# Patient Record
Sex: Male | Born: 1950 | Race: White | Hispanic: No | Marital: Married | Smoking: Never smoker
Health system: Southern US, Community
[De-identification: ages and names within clinical notes are randomized; demographics above are authoritative.]

## PROBLEM LIST (undated history)

## (undated) DIAGNOSIS — F4310 Post-traumatic stress disorder, unspecified: Secondary | ICD-10-CM

## (undated) DIAGNOSIS — M1990 Unspecified osteoarthritis, unspecified site: Secondary | ICD-10-CM

## (undated) DIAGNOSIS — I10 Essential (primary) hypertension: Secondary | ICD-10-CM

## (undated) HISTORY — PX: CHOLECYSTECTOMY: SHX55

---

## 2018-01-03 HISTORY — PX: BACK SURGERY: SHX140

## 2018-12-23 ENCOUNTER — Other Ambulatory Visit: Payer: Self-pay | Admitting: Neurosurgery

## 2018-12-30 NOTE — Pre-Procedure Instructions (Addendum)
Steven Cohen  12/30/2018      [facility] [address] [place] [address] Phone: [phone] Fax: [phone]    Your procedure is scheduled on Dec. 4  Report to [facility] Entrance A at 7:30 A.M.  Call this number if you have problems the morning of surgery:  [phone]   Remember:  Do not eat or drink after midnight.      Take these medicines the morning of surgery with A SIP OF WATER :              Methocarbamol (robaxin) if needed             Eye drops if needed               7 days prior to surgery STOP taking any Aspirin (unless otherwise instructed by your surgeon), Aleve, Naproxen, Ibuprofen, Motrin, Advil, Goody's, BC's, all herbal medications, fish oil, and all vitamins.    Do not wear jewelry.  Do not wear lotions, powders, or perfumes, or deodorant.  Do not shave 48 hours prior to surgery.  Men may shave face and neck.  Do not bring valuables to the hospital.  [facility] is not responsible for any belongings or valuables.  Contacts, dentures or bridgework may not be worn into surgery.  Leave your suitcase in the car.  After surgery it may be brought to your room.  For patients admitted to the hospital, discharge time will be determined by your treatment team.  Patients discharged the day of surgery will not be allowed to drive home.    Special instructions:   [facility]- Preparing For Surgery  Before surgery, you can play an important role. Because skin is not sterile, your skin needs to be as free of germs as possible. You can reduce the number of germs on your skin by washing with CHG (chlorahexidine gluconate) Soap before surgery.  CHG is an antiseptic cleaner which kills germs and bonds with the skin to continue killing germs even after washing.    Oral Hygiene is also important to reduce your risk of infection.  Remember - BRUSH YOUR TEETH THE MORNING OF SURGERY WITH  YOUR REGULAR TOOTHPASTE  Please do not use if you have an allergy to CHG or antibacterial soaps. If your skin becomes reddened/irritated stop using the CHG.  Do not shave (including legs and underarms) for at least 48 hours prior to first CHG shower. It is OK to shave your face.  Please follow these instructions carefully.   1. Shower the NIGHT BEFORE SURGERY and the MORNING OF SURGERY with CHG.   2. If you chose to wash your hair, wash your hair first as usual with your normal shampoo.  3. After you shampoo, rinse your hair and body thoroughly to remove the shampoo.  4. Use CHG as you would any other liquid soap. You can apply CHG directly to the skin and wash gently with a scrungie or a clean washcloth.   5. Apply the CHG Soap to your body ONLY FROM THE NECK DOWN.  Do not use on open wounds or open sores. Avoid contact with your eyes, ears, mouth and genitals (private parts). Wash Face and genitals (private parts)  with your normal soap.  6. Wash thoroughly, paying special attention to the area where your surgery will be performed.  7. Thoroughly rinse your body with warm water from the neck down.  8. DO NOT shower/wash with your normal soap after using and rinsing off the CHG Soap.  9. Pat yourself dry with a CLEAN TOWEL.  10. Wear CLEAN PAJAMAS to bed the night before surgery, wear comfortable clothes the morning of surgery  11. Place CLEAN SHEETS on your bed the night of your first shower and DO NOT SLEEP WITH PETS.    Day of Surgery:  Do not apply any deodorants/lotions.  Please wear clean clothes to the hospital/surgery center.   Remember to brush your teeth WITH YOUR REGULAR TOOTHPASTE.    Please read over the following fact sheets that you were given. Coughing and Deep Breathing and Surgical Site Infection Prevention

## 2018-12-31 ENCOUNTER — Other Ambulatory Visit (HOSPITAL_COMMUNITY)
Admission: RE | Admit: 2018-12-31 | Discharge: 2018-12-31 | Disposition: A | Discharge status: Home / Self Care | Payer: No Typology Code available for payment source | Source: Ambulatory Visit | Attending: Neurosurgery | Admitting: Neurosurgery

## 2018-12-31 ENCOUNTER — Other Ambulatory Visit: Payer: Self-pay

## 2018-12-31 ENCOUNTER — Encounter (HOSPITAL_COMMUNITY)
Admission: RE | Admit: 2018-12-31 | Discharge: 2018-12-31 | Disposition: A | Discharge status: Home / Self Care | Payer: No Typology Code available for payment source | Source: Ambulatory Visit | Attending: Neurosurgery | Admitting: Neurosurgery

## 2018-12-31 ENCOUNTER — Encounter (HOSPITAL_COMMUNITY): Payer: Self-pay

## 2018-12-31 DIAGNOSIS — I498 Other specified cardiac arrhythmias: Secondary | ICD-10-CM | POA: Insufficient documentation

## 2018-12-31 DIAGNOSIS — Z01818 Encounter for other preprocedural examination: Secondary | ICD-10-CM | POA: Insufficient documentation

## 2018-12-31 HISTORY — DX: Unspecified osteoarthritis, unspecified site: M19.90

## 2018-12-31 HISTORY — DX: Post-traumatic stress disorder, unspecified: F43.10

## 2018-12-31 HISTORY — DX: Essential (primary) hypertension: I10

## 2018-12-31 LAB — CBC WITH DIFFERENTIAL/PLATELET
Abs Immature Granulocytes: 0.03 10*3/uL (ref 0.00–0.07)
Basophils Absolute: 0.1 10*3/uL (ref 0.0–0.1)
Basophils Relative: 1 %
Eosinophils Absolute: 0.2 10*3/uL (ref 0.0–0.5)
Eosinophils Relative: 2 %
HCT: 42.7 % (ref 39.0–52.0)
Hemoglobin: 14.2 g/dL (ref 13.0–17.0)
Immature Granulocytes: 0 %
Lymphocytes Relative: 23 %
Lymphs Abs: 1.7 10*3/uL (ref 0.7–4.0)
MCH: 33.6 pg (ref 26.0–34.0)
MCHC: 33.3 g/dL (ref 30.0–36.0)
MCV: 100.9 fL — ABNORMAL HIGH (ref 80.0–100.0)
Monocytes Absolute: 0.7 10*3/uL (ref 0.1–1.0)
Monocytes Relative: 10 %
Neutro Abs: 4.8 10*3/uL (ref 1.7–7.7)
Neutrophils Relative %: 64 %
Platelets: 250 10*3/uL (ref 150–400)
RBC: 4.23 MIL/uL (ref 4.22–5.81)
RDW: 12.9 % (ref 11.5–15.5)
WBC: 7.5 10*3/uL (ref 4.0–10.5)
nRBC: 0 % (ref 0.0–0.2)

## 2018-12-31 LAB — SURGICAL PCR SCREEN
MRSA, PCR: NEGATIVE
Staphylococcus aureus: NEGATIVE

## 2018-12-31 LAB — TYPE AND SCREEN
ABO/RH(D): O POS
Antibody Screen: NEGATIVE

## 2018-12-31 LAB — ABO/RH: ABO/RH(D): O POS

## 2018-12-31 NOTE — Progress Notes (Signed)
PCP - [facility] in [facility] Cardiologist - na  PPM/ICD - na Device Orders - na Rep Notified -   Chest x-ray - na EKG - 12/31/18 Stress Test - na ECHO - na Cardiac Cath - na  Sleep Study - na CPAP -   Fasting Blood Sugar - na Checks Blood Sugar _____ times a day  Blood Thinner Instructions:na Aspirin Instructions:na  ERAS Protcol -na PRE-SURGERY Ensure or G2-   COVID TEST- 12/31/18   Anesthesia review:   Patient denies shortness of breath, fever, cough and chest pain at PAT appointment   All instructions explained to the patient, with a verbal understanding of the material. Patient agrees to go over the instructions while at home for a better understanding. Patient also instructed to self quarantine after being tested for COVID-19. The opportunity to ask questions was provided.

## 2019-01-02 LAB — NOVEL CORONAVIRUS, NAA (HOSP ORDER, SEND-OUT TO REF LAB; TAT 18-24 HRS): SARS-CoV-2, NAA: NOT DETECTED

## 2019-01-02 NOTE — Anesthesia Preprocedure Evaluation (Addendum)
Anesthesia Evaluation  Patient identified by MRN, date of birth, ID band Patient awake    Reviewed: Allergy & Precautions, NPO status , Patient's Chart, lab work & pertinent test results  Airway Mallampati: III  TM Distance: >3 FB Neck ROM: Full    Dental  (+) Edentulous Upper, Edentulous Lower, Dental Advisory Given   Pulmonary neg pulmonary ROS,    Pulmonary exam normal breath sounds clear to auscultation       Cardiovascular hypertension, Pt. on medications Normal cardiovascular exam Rhythm:Regular Rate:Normal  ECG: NSR, rate 67   Neuro/Psych PSYCHIATRIC DISORDERS Anxiety PTSD (post-traumatic stress disorder)negative neurological ROS     GI/Hepatic negative GI ROS, Neg liver ROS,   Endo/Other  negative endocrine ROS  Renal/GU negative Renal ROS     Musculoskeletal negative musculoskeletal ROS (+)   Abdominal   Peds  Hematology negative hematology ROS (+)   Anesthesia Other Findings Spondylolisthesis  Reproductive/Obstetrics                           Anesthesia Physical Anesthesia Plan  ASA: II  Anesthesia Plan: General   Post-op Pain Management:    Induction: Intravenous  PONV Risk Score and Plan: 3 and Midazolam, Dexamethasone, Ondansetron and Treatment may vary due to age or medical condition  Airway Management Planned: Oral ETT  Additional Equipment:   Intra-op Plan:   Post-operative Plan: Extubation in OR  Informed Consent: I have reviewed the patients History and Physical, chart, labs and discussed the procedure including the risks, benefits and alternatives for the proposed anesthesia with the patient or authorized representative who has indicated his/her understanding and acceptance.       Plan Discussed with: CRNA  Anesthesia Plan Comments:        Anesthesia Quick Evaluation

## 2019-01-03 ENCOUNTER — Other Ambulatory Visit: Payer: Self-pay

## 2019-01-03 ENCOUNTER — Encounter (HOSPITAL_COMMUNITY): Payer: Self-pay

## 2019-01-03 ENCOUNTER — Inpatient Hospital Stay (HOSPITAL_COMMUNITY): Payer: No Typology Code available for payment source | Admitting: Anesthesiology

## 2019-01-03 ENCOUNTER — Inpatient Hospital Stay (HOSPITAL_COMMUNITY)
Admission: RE | Admit: 2019-01-03 | Discharge: 2019-01-04 | DRG: 455 | Disposition: A | Discharge status: Home / Self Care | Payer: No Typology Code available for payment source | Attending: Neurosurgery | Admitting: Neurosurgery

## 2019-01-03 ENCOUNTER — Inpatient Hospital Stay (HOSPITAL_COMMUNITY)
Admission: RE | Disposition: A | Discharge status: Home / Self Care | Payer: Self-pay | Source: Home / Self Care | Attending: Neurosurgery

## 2019-01-03 ENCOUNTER — Inpatient Hospital Stay (HOSPITAL_COMMUNITY): Payer: No Typology Code available for payment source

## 2019-01-03 DIAGNOSIS — M48061 Spinal stenosis, lumbar region without neurogenic claudication: Secondary | ICD-10-CM | POA: Diagnosis present

## 2019-01-03 DIAGNOSIS — Z419 Encounter for procedure for purposes other than remedying health state, unspecified: Secondary | ICD-10-CM

## 2019-01-03 DIAGNOSIS — M4807 Spinal stenosis, lumbosacral region: Secondary | ICD-10-CM | POA: Diagnosis present

## 2019-01-03 DIAGNOSIS — Z888 Allergy status to other drugs, medicaments and biological substances status: Secondary | ICD-10-CM | POA: Diagnosis not present

## 2019-01-03 DIAGNOSIS — Z20828 Contact with and (suspected) exposure to other viral communicable diseases: Secondary | ICD-10-CM | POA: Diagnosis present

## 2019-01-03 DIAGNOSIS — M4316 Spondylolisthesis, lumbar region: Secondary | ICD-10-CM | POA: Diagnosis present

## 2019-01-03 DIAGNOSIS — M5116 Intervertebral disc disorders with radiculopathy, lumbar region: Secondary | ICD-10-CM | POA: Diagnosis present

## 2019-01-03 DIAGNOSIS — Z886 Allergy status to analgesic agent status: Secondary | ICD-10-CM

## 2019-01-03 DIAGNOSIS — M4726 Other spondylosis with radiculopathy, lumbar region: Secondary | ICD-10-CM | POA: Diagnosis present

## 2019-01-03 DIAGNOSIS — Z880 Allergy status to penicillin: Secondary | ICD-10-CM | POA: Diagnosis not present

## 2019-01-03 DIAGNOSIS — M4310 Spondylolisthesis, site unspecified: Secondary | ICD-10-CM | POA: Diagnosis present

## 2019-01-03 DIAGNOSIS — Z79899 Other long term (current) drug therapy: Secondary | ICD-10-CM | POA: Diagnosis not present

## 2019-01-03 DIAGNOSIS — I10 Essential (primary) hypertension: Secondary | ICD-10-CM | POA: Diagnosis present

## 2019-01-03 LAB — BASIC METABOLIC PANEL
Anion gap: 11 (ref 5–15)
BUN: 17 mg/dL (ref 8–23)
CO2: 21 mmol/L — ABNORMAL LOW (ref 22–32)
Calcium: 9.8 mg/dL (ref 8.9–10.3)
Chloride: 106 mmol/L (ref 98–111)
Creatinine, Ser: 0.96 mg/dL (ref 0.61–1.24)
GFR calc Af Amer: >60 mL/min (ref >60)
GFR calc non Af Amer: >60 mL/min (ref >60)
Glucose, Bld: 112 mg/dL — ABNORMAL HIGH (ref 70–99)
Potassium: 4 mmol/L (ref 3.5–5.1)
Sodium: 138 mmol/L (ref 135–145)

## 2019-01-03 SURGERY — POSTERIOR LUMBAR FUSION 2 LEVEL
Anesthesia: General | Site: Back

## 2019-01-03 MED ORDER — CEFAZOLIN SODIUM-DEXTROSE 2-4 GM/100ML-% IV SOLN
2.0000 g | Freq: Three times a day (TID) | INTRAVENOUS | Status: AC
  Administered 2019-01-03 – 2019-01-04 (×2): 2 g via INTRAVENOUS
  Filled 2019-01-03 (×2): qty 100

## 2019-01-03 MED ORDER — PRESERVISION AREDS 2 PO CAPS: ORAL_CAPSULE | Freq: Two times a day (BID) | ORAL | Status: DC

## 2019-01-03 MED ORDER — SUGAMMADEX SODIUM 200 MG/2ML IV SOLN
INTRAVENOUS | Status: DC | PRN
  Administered 2019-01-03: 200 mg via INTRAVENOUS

## 2019-01-03 MED ORDER — PHENOL 1.4 % MT LIQD: 1.0000 | OROMUCOSAL | Status: DC | PRN

## 2019-01-03 MED ORDER — MENTHOL 3 MG MT LOZG: 1.0000 | LOZENGE | OROMUCOSAL | Status: DC | PRN

## 2019-01-03 MED ORDER — PROPOFOL 10 MG/ML IV BOLUS
INTRAVENOUS | Status: AC
  Filled 2019-01-03: qty 20

## 2019-01-03 MED ORDER — PROPOFOL 10 MG/ML IV BOLUS
INTRAVENOUS | Status: DC | PRN
  Administered 2019-01-03: 150 mg via INTRAVENOUS

## 2019-01-03 MED ORDER — ONDANSETRON HCL 4 MG PO TABS: 4.0000 mg | ORAL_TABLET | Freq: Four times a day (QID) | ORAL | Status: DC | PRN

## 2019-01-03 MED ORDER — FENTANYL CITRATE (PF) 250 MCG/5ML IJ SOLN
INTRAMUSCULAR | Status: AC
  Filled 2019-01-03: qty 5

## 2019-01-03 MED ORDER — ACETAMINOPHEN 500 MG PO TABS
1000.0000 mg | ORAL_TABLET | Freq: Once | ORAL | Status: AC
  Administered 2019-01-03: 1000 mg via ORAL
  Filled 2019-01-03: qty 2

## 2019-01-03 MED ORDER — ONDANSETRON HCL 4 MG/2ML IJ SOLN
INTRAMUSCULAR | Status: AC
  Filled 2019-01-03: qty 4

## 2019-01-03 MED ORDER — MIDAZOLAM HCL 5 MG/5ML IJ SOLN
INTRAMUSCULAR | Status: DC | PRN
  Administered 2019-01-03: 2 mg via INTRAVENOUS

## 2019-01-03 MED ORDER — ONDANSETRON HCL 4 MG/2ML IJ SOLN: 4.0000 mg | Freq: Four times a day (QID) | INTRAMUSCULAR | Status: DC | PRN

## 2019-01-03 MED ORDER — PHENYLEPHRINE 40 MCG/ML (10ML) SYRINGE FOR IV PUSH (FOR BLOOD PRESSURE SUPPORT)
PREFILLED_SYRINGE | INTRAVENOUS | Status: AC
  Filled 2019-01-03: qty 10

## 2019-01-03 MED ORDER — HYDROMORPHONE HCL 1 MG/ML IJ SOLN: 1.0000 mg | INTRAMUSCULAR | Status: DC | PRN

## 2019-01-03 MED ORDER — GLYCOPYRROLATE PF 0.2 MG/ML IJ SOSY
PREFILLED_SYRINGE | INTRAMUSCULAR | Status: AC
  Filled 2019-01-03: qty 1

## 2019-01-03 MED ORDER — VITAMIN D3 10 MCG (400 UNIT) PO TABS: 400.0000 [IU] | ORAL_TABLET | ORAL | Status: DC

## 2019-01-03 MED ORDER — VANCOMYCIN HCL 1000 MG IV SOLR
INTRAVENOUS | Status: DC | PRN
  Administered 2019-01-03: 1000 mg

## 2019-01-03 MED ORDER — VANCOMYCIN HCL 1000 MG IV SOLR
INTRAVENOUS | Status: AC
  Filled 2019-01-03: qty 1000

## 2019-01-03 MED ORDER — MIDAZOLAM HCL 2 MG/2ML IJ SOLN
INTRAMUSCULAR | Status: AC
  Filled 2019-01-03: qty 2

## 2019-01-03 MED ORDER — DEXAMETHASONE SODIUM PHOSPHATE 10 MG/ML IJ SOLN
10.0000 mg | Freq: Once | INTRAMUSCULAR | Status: AC
  Administered 2019-01-03: 12:00:00 10 mg via INTRAVENOUS
  Filled 2019-01-03: qty 1

## 2019-01-03 MED ORDER — CHLORHEXIDINE GLUCONATE CLOTH 2 % EX PADS: 6.0000 | MEDICATED_PAD | Freq: Once | CUTANEOUS | Status: DC

## 2019-01-03 MED ORDER — SODIUM CHLORIDE 0.9 % IV SOLN: 250.0000 mL | INTRAVENOUS | Status: DC

## 2019-01-03 MED ORDER — VITAMIN C 500 MG PO TABS: 500.0000 mg | ORAL_TABLET | Freq: Every day | ORAL | Status: DC

## 2019-01-03 MED ORDER — DIPHENHYDRAMINE HCL 50 MG/ML IJ SOLN
INTRAMUSCULAR | Status: AC
  Filled 2019-01-03: qty 1

## 2019-01-03 MED ORDER — THROMBIN 5000 UNITS EX SOLR
CUTANEOUS | Status: AC
  Filled 2019-01-03: qty 5000

## 2019-01-03 MED ORDER — FLEET ENEMA 7-19 GM/118ML RE ENEM: 1.0000 | ENEMA | Freq: Once | RECTAL | Status: DC | PRN

## 2019-01-03 MED ORDER — PHENYLEPHRINE HCL-NACL 10-0.9 MG/250ML-% IV SOLN
INTRAVENOUS | Status: DC | PRN
  Administered 2019-01-03: 30 ug/min via INTRAVENOUS
  Administered 2019-01-03: 75 ug/min via INTRAVENOUS

## 2019-01-03 MED ORDER — ACETAMINOPHEN 325 MG PO TABS: 650.0000 mg | ORAL_TABLET | ORAL | Status: DC | PRN

## 2019-01-03 MED ORDER — ONDANSETRON HCL 4 MG/2ML IJ SOLN
INTRAMUSCULAR | Status: AC
  Filled 2019-01-03: qty 2

## 2019-01-03 MED ORDER — OXYCODONE HCL 5 MG PO TABS
10.0000 mg | ORAL_TABLET | ORAL | Status: DC | PRN
  Administered 2019-01-03 – 2019-01-04 (×4): 10 mg via ORAL
  Filled 2019-01-03 (×4): qty 2

## 2019-01-03 MED ORDER — THROMBIN 20000 UNITS EX SOLR
CUTANEOUS | Status: AC
  Filled 2019-01-03: qty 20000

## 2019-01-03 MED ORDER — DEXAMETHASONE SODIUM PHOSPHATE 10 MG/ML IJ SOLN
INTRAMUSCULAR | Status: AC
  Filled 2019-01-03: qty 2

## 2019-01-03 MED ORDER — OMEGA-3-ACID ETHYL ESTERS 1 G PO CAPS: 1.0000 g | ORAL_CAPSULE | Freq: Two times a day (BID) | ORAL | Status: DC

## 2019-01-03 MED ORDER — DIAZEPAM 5 MG PO TABS
5.0000 mg | ORAL_TABLET | Freq: Four times a day (QID) | ORAL | Status: DC | PRN
  Administered 2019-01-03 (×2): 5 mg via ORAL
  Administered 2019-01-04: 10 mg via ORAL
  Filled 2019-01-03: qty 1
  Filled 2019-01-03: qty 2
  Filled 2019-01-03: qty 1

## 2019-01-03 MED ORDER — ROCURONIUM BROMIDE 10 MG/ML (PF) SYRINGE
PREFILLED_SYRINGE | INTRAVENOUS | Status: AC
  Filled 2019-01-03: qty 10

## 2019-01-03 MED ORDER — SODIUM CHLORIDE 0.9% FLUSH: 3.0000 mL | Freq: Two times a day (BID) | INTRAVENOUS | Status: DC

## 2019-01-03 MED ORDER — POLYVINYL ALCOHOL 1.4 % OP SOLN
1.0000 [drp] | Freq: Three times a day (TID) | OPHTHALMIC | Status: DC | PRN
  Filled 2019-01-03: qty 15

## 2019-01-03 MED ORDER — CARBOXYMETHYLCELLULOSE SODIUM 0.25 % OP SOLN: 1.0000 [drp] | Freq: Three times a day (TID) | OPHTHALMIC | Status: DC | PRN

## 2019-01-03 MED ORDER — POLYETHYLENE GLYCOL 3350 17 G PO PACK: 17.0000 g | PACK | Freq: Every day | ORAL | Status: DC | PRN

## 2019-01-03 MED ORDER — HYDROCODONE-ACETAMINOPHEN 10-325 MG PO TABS: 1.0000 | ORAL_TABLET | ORAL | Status: DC | PRN

## 2019-01-03 MED ORDER — METHOCARBAMOL 500 MG PO TABS
500.0000 mg | ORAL_TABLET | Freq: Three times a day (TID) | ORAL | Status: DC | PRN
  Administered 2019-01-04: 500 mg via ORAL
  Filled 2019-01-03: qty 1

## 2019-01-03 MED ORDER — ACETAMINOPHEN 650 MG RE SUPP: 650.0000 mg | RECTAL | Status: DC | PRN

## 2019-01-03 MED ORDER — BISACODYL 10 MG RE SUPP: 10.0000 mg | Freq: Every day | RECTAL | Status: DC | PRN

## 2019-01-03 MED ORDER — ONDANSETRON HCL 4 MG/2ML IJ SOLN: 4.0000 mg | Freq: Once | INTRAMUSCULAR | Status: DC | PRN

## 2019-01-03 MED ORDER — ROCURONIUM BROMIDE 10 MG/ML (PF) SYRINGE
PREFILLED_SYRINGE | INTRAVENOUS | Status: DC | PRN
  Administered 2019-01-03: 50 mg via INTRAVENOUS
  Administered 2019-01-03: 100 mg via INTRAVENOUS

## 2019-01-03 MED ORDER — BUPIVACAINE HCL (PF) 0.25 % IJ SOLN
INTRAMUSCULAR | Status: AC
  Filled 2019-01-03: qty 30

## 2019-01-03 MED ORDER — THROMBIN 20000 UNITS EX SOLR
CUTANEOUS | Status: DC | PRN
  Administered 2019-01-03: 20 mL via TOPICAL

## 2019-01-03 MED ORDER — CEFAZOLIN SODIUM-DEXTROSE 2-4 GM/100ML-% IV SOLN
2.0000 g | INTRAVENOUS | Status: AC
  Administered 2019-01-03: 2 g via INTRAVENOUS
  Filled 2019-01-03: qty 100

## 2019-01-03 MED ORDER — FENTANYL CITRATE (PF) 250 MCG/5ML IJ SOLN
INTRAMUSCULAR | Status: DC | PRN
  Administered 2019-01-03: 100 ug via INTRAVENOUS
  Administered 2019-01-03 (×3): 50 ug via INTRAVENOUS
  Administered 2019-01-03: 100 ug via INTRAVENOUS
  Administered 2019-01-03: 50 ug via INTRAVENOUS

## 2019-01-03 MED ORDER — LISINOPRIL 20 MG PO TABS
20.0000 mg | ORAL_TABLET | Freq: Every evening | ORAL | Status: DC
  Administered 2019-01-03: 20 mg via ORAL
  Filled 2019-01-03: qty 1

## 2019-01-03 MED ORDER — THROMBIN (RECOMBINANT) 20000 UNITS EX SOLR
CUTANEOUS | Status: AC
  Filled 2019-01-03: qty 20000

## 2019-01-03 MED ORDER — FENTANYL CITRATE (PF) 100 MCG/2ML IJ SOLN
25.0000 ug | INTRAMUSCULAR | Status: DC | PRN
  Administered 2019-01-03 (×2): 50 ug via INTRAVENOUS

## 2019-01-03 MED ORDER — LIDOCAINE 2% (20 MG/ML) 5 ML SYRINGE
INTRAMUSCULAR | Status: AC
  Filled 2019-01-03: qty 5

## 2019-01-03 MED ORDER — LIDOCAINE 2% (20 MG/ML) 5 ML SYRINGE
INTRAMUSCULAR | Status: AC
  Filled 2019-01-03: qty 10

## 2019-01-03 MED ORDER — DEXAMETHASONE SODIUM PHOSPHATE 10 MG/ML IJ SOLN
INTRAMUSCULAR | Status: AC
  Filled 2019-01-03: qty 1

## 2019-01-03 MED ORDER — FENTANYL CITRATE (PF) 100 MCG/2ML IJ SOLN
INTRAMUSCULAR | Status: AC
  Filled 2019-01-03: qty 2

## 2019-01-03 MED ORDER — SODIUM CHLORIDE 0.9 % IV SOLN
INTRAVENOUS | Status: DC | PRN
  Administered 2019-01-03: 1000 mL

## 2019-01-03 MED ORDER — VITAMIN E 180 MG (400 UNIT) PO CAPS: 400.0000 [IU] | ORAL_CAPSULE | ORAL | Status: DC

## 2019-01-03 MED ORDER — SODIUM CHLORIDE 0.9% FLUSH: 3.0000 mL | INTRAVENOUS | Status: DC | PRN

## 2019-01-03 MED ORDER — 0.9 % SODIUM CHLORIDE (POUR BTL) OPTIME
TOPICAL | Status: DC | PRN
  Administered 2019-01-03: 1000 mL

## 2019-01-03 MED ORDER — ONDANSETRON HCL 4 MG/2ML IJ SOLN
INTRAMUSCULAR | Status: DC | PRN
  Administered 2019-01-03: 4 mg via INTRAVENOUS

## 2019-01-03 MED ORDER — ALBUMIN HUMAN 5 % IV SOLN
INTRAVENOUS | Status: DC | PRN
  Administered 2019-01-03: 13:00:00 via INTRAVENOUS

## 2019-01-03 MED ORDER — LACTATED RINGERS IV SOLN
INTRAVENOUS | Status: DC | PRN
  Administered 2019-01-03 (×3): via INTRAVENOUS

## 2019-01-03 SURGICAL SUPPLY — 53 items
BAG DECANTER FOR FLEXI CONT (MISCELLANEOUS) ×3 IMPLANT
BENZOIN TINCTURE PRP APPL 2/3 (GAUZE/BANDAGES/DRESSINGS) ×3 IMPLANT
BLADE CLIPPER SURG (BLADE) IMPLANT
BUR CUTTER 7.0 ROUND (BURR) IMPLANT
CANISTER SUCT 3000ML PPV (MISCELLANEOUS) ×3 IMPLANT
CAP LCK SPNE (Orthopedic Implant) ×6 IMPLANT
CAP LOCK SPINE RADIUS (Orthopedic Implant) ×6 IMPLANT
CAP LOCKING (Orthopedic Implant) ×12 IMPLANT
CARTRIDGE OIL MAESTRO DRILL (MISCELLANEOUS) ×1 IMPLANT
COVER BACK TABLE 60X90IN (DRAPES) ×3 IMPLANT
DECANTER SPIKE VIAL GLASS SM (MISCELLANEOUS) ×3 IMPLANT
DEVICE INTERBODY ELEVATE 10X23 (Cage) ×6 IMPLANT
DEVICE INTERBODY ELEVATE 23X10 (Cage) ×6 IMPLANT
DIFFUSER DRILL AIR PNEUMATIC (MISCELLANEOUS) ×3 IMPLANT
DRAPE C-ARM 42X72 X-RAY (DRAPES) ×6 IMPLANT
DRAPE HALF SHEET 40X57 (DRAPES) IMPLANT
DRAPE LAPAROTOMY 100X72X124 (DRAPES) ×3 IMPLANT
DRAPE SURG 17X23 STRL (DRAPES) ×12 IMPLANT
DRSG OPSITE POSTOP 4X8 (GAUZE/BANDAGES/DRESSINGS) ×3 IMPLANT
DURAPREP 26ML APPLICATOR (WOUND CARE) ×3 IMPLANT
ELECT REM PT RETURN 9FT ADLT (ELECTROSURGICAL) ×3
ELECTRODE REM PT RTRN 9FT ADLT (ELECTROSURGICAL) ×1 IMPLANT
GLOVE BIO SURGEON STRL SZ 6.5 (GLOVE) ×2 IMPLANT
GLOVE BIO SURGEONS STRL SZ 6.5 (GLOVE) ×1
GLOVE BIOGEL PI IND STRL 6.5 (GLOVE) ×1 IMPLANT
GLOVE BIOGEL PI INDICATOR 6.5 (GLOVE) ×2
GLOVE ECLIPSE 9.0 STRL (GLOVE) ×6 IMPLANT
GOWN STRL REUS W/ TWL LRG LVL3 (GOWN DISPOSABLE) IMPLANT
GOWN STRL REUS W/ TWL XL LVL3 (GOWN DISPOSABLE) ×2 IMPLANT
GOWN STRL REUS W/TWL 2XL LVL3 (GOWN DISPOSABLE) IMPLANT
GOWN STRL REUS W/TWL LRG LVL3 (GOWN DISPOSABLE)
GOWN STRL REUS W/TWL XL LVL3 (GOWN DISPOSABLE) ×4
KIT BASIN OR (CUSTOM PROCEDURE TRAY) ×3 IMPLANT
KIT TURNOVER KIT B (KITS) ×3 IMPLANT
MILL MEDIUM DISP (BLADE) ×3 IMPLANT
NEEDLE HYPO 22GX1.5 SAFETY (NEEDLE) ×3 IMPLANT
NS IRRIG 1000ML POUR BTL (IV SOLUTION) ×3 IMPLANT
OIL CARTRIDGE MAESTRO DRILL (MISCELLANEOUS) ×3
PACK LAMINECTOMY NEURO (CUSTOM PROCEDURE TRAY) ×3 IMPLANT
ROD 70MM (Rod) ×4 IMPLANT
ROD SPNL 70X5.5 NS TI RDS (Rod) ×2 IMPLANT
SCREW 5.75 X 635 (Screw) ×3 IMPLANT
SCREW 5.75X40M (Screw) ×3 IMPLANT
SCREW 5.75X45MM (Screw) ×12 IMPLANT
SPONGE LAP 4X18 RFD (DISPOSABLE) ×3 IMPLANT
SPONGE SURGIFOAM ABS GEL 100 (HEMOSTASIS) ×3 IMPLANT
SUT VIC AB 0 CT1 18XCR BRD8 (SUTURE) ×2 IMPLANT
SUT VIC AB 0 CT1 8-18 (SUTURE) ×4
SUT VIC AB 2-0 CT1 18 (SUTURE) ×3 IMPLANT
SUT VIC AB 3-0 SH 8-18 (SUTURE) ×6 IMPLANT
TOWEL GREEN STERILE (TOWEL DISPOSABLE) ×3 IMPLANT
TOWEL GREEN STERILE FF (TOWEL DISPOSABLE) ×3 IMPLANT
TRAY FOLEY MTR SLVR 16FR STAT (SET/KITS/TRAYS/PACK) ×3 IMPLANT

## 2019-01-03 NOTE — Op Note (Signed)
Date of procedure: 01/03/2019  Date of dictation: Same  Service: Neurosurgery  Preoperative diagnosis: L4-5 facet arthropathy with stenosis and radiculopathy, L5-S1 grade 2 degenerative spondylolisthesis with severe foraminal stenosis and radiculopathy.  Postoperative diagnosis: Same  Procedure Name: Bilateral L4-5 and L5-S1 decompressive laminotomies and foraminotomies, more than would be required for simple interbody fusion alone.  L5-S1 ponte osteotomies for sagittal plane correction  L4-5 and L5-S1 posterior lumbar interbody fusion utilizing interbody cages and locally harvested autograft  L4-5 and S1 posterior lateral arthrodesis utilizing segmental pedicle screw fixation and local autograft  Surgeon:Stephaniemarie Stoffel A.Dezmon Conover, M.D.  Asst. Surgeon: Doran Durand, NP  Anesthesia: General  Indication: 68 year old male with intractable back and bilateral lower extremity pain.  Work-up demonstrates evidence of degenerative spondylolisthesis with severe foraminal stenosis at L5-S1.  Patient also with marked facet arthropathy with facet joint diastases and resultant stenosis at L4-5.  Patient presents now for two-level lumbar decompression and fusion in hopes of improving his symptoms.  Operative note: After induction of anesthesia, patient position prone onto Wilson frame and appropriate padded.  Patient's lumbar region prepped and draped in a typical fashion.  Incision made from L4-S1.  Dissection performed bilaterally.  Retractor placed.  Fluoroscopy used.  Levels confirmed.  Decompressive laminotomies and facetectomies were then performed bilaterally at L4-5 and L5-S1 to remove the inferior aspect of the lamina of L4 the entire inferior facet of L4 bilaterally the superior aspect of the lamina of L5 the inferior aspect of the lamina of L5 the entire facet joint complex at L5-S1 bilaterally and the superior rim of the S1 lamina.  Ligament flavum elevated and resected.  Aggressive foraminotomies completed on  the course the exiting L4-L5 and S1 nerve roots.  Epidural venous plexus coagulated and cut.  Ponte osteotomies were then completed at L5-S1 to facilitate reduction of the patient's deformity at L5-S1.  Bilateral discectomies then performed at L5-S1.  This patient then prepared for interbody fusion.  With a distractor placed patient's right side to space was cleaned of all soft tissue.  A 10 mm extra lordotic [employer] expandable cage packed with locally harvested autograft was impacted into place at L5-S1 and expanded to its full extent.  Distractor removed patient's right side.  The space prepared on the right side.  Morselized autograft packed in the interspace.  The second cage packed with autograft was then impacted in the place and expanded to its full extent.  Procedures then repeated at L4-5 in a similar fashion utilizing 10 mm standard expandable cages and local autograft.  Pedicles at L4-L5 and S1 were then identified using surface landmarks and intraoperative fluoroscopy.  Superficial bone overlying the pedicle was then removed and high-speed drill.  Pedicle was then probed using a pedicle awl.  Each pedicle all track was probed and found to be solidly within the bone.  Each pedicle all track was then tapped with a screw tap.  Each screw temple was probed and found to be solid within the bone.  5.75 mm radius brand screws from [employer] were placed bilaterally at L4-L5 and S1.  Final images reveal good position of the cages and the hardware at the proper operative level with normal alignment of spine.  Wound is then irrigated one final time.  Transverse processes and residual facets were decorticated.  Morselized autograft was packed posterior laterally.  Short segment titanium rod was then placed over the screw heads at L4-L5 and S1.  Locking caps placed over the screws.  Locking caps then engaged with  a construct under mild compression.  Gelfoam was placed over the laminotomy defects.  Vancomycin  powder was placed in deep wound space.  Wound was then closed in layers of Vicryl sutures.  Steri-Strips and sterile dressing were applied.  No apparent complications.  The patient tolerated the procedure well and he returns to the recovery room postop.

## 2019-01-03 NOTE — Progress Notes (Signed)
Pharmacy Antibiotic Note  Steven Cohen is a 68 y.o. male admitted on 01/03/2019 with spondylolisthesis; pt is S/P surgery this afternoon .  Pharmacy has been consulted for post-op surgical prophylaxis dosing.   Pt rec'd cefazolin 2 gm IV X 1 at 11:26 AM today pre op; per RN, pt tolerated dose well. Per surgeon note, no drains in place post op.  Scr 0.96; CrCl 71.6 ml/lmin  Plan: Cefazolin 2 gm IV Q 8 hrs X 2 doses, first dose at 19:30 PM this evening  Height: 5\' 9"  (175.3 cm) Weight: 185 lb (83.9 kg) IBW/kg (Calculated) : 70.7  Temp (24hrs), Avg:98.2 F (36.8 C), Min:97.8 F (36.6 C), Max:98.7 F (37.1 C)  Recent Labs  Lab 12/31/18 1351 01/03/19 0637  WBC 7.5  --   CREATININE  --  0.96    Estimated Creatinine Clearance: 71.6 mL/min (by C-G formula based on SCr of 0.96 mg/dL).    Allergies  Allergen Reactions  . Aspirin Itching and Rash  . Gabapentin Itching and Rash  . Penicillins Itching and Rash    Did it involve swelling of the face/tongue/throat, SOB, or low BP? No Did it involve sudden or severe rash/hives, skin peeling, or any reaction on the inside of your mouth or nose? No Did you need to seek medical attention at a hospital or doctor's office? No When did it last happen?10-15 years ago If all above answers are "NO", may proceed with cephalosporin use.     Microbiology results: 12/1 MRSA PCR: negative 12/1 COVID: negative  Thank you for allowing pharmacy to be a part of this patient's care.  Gillermina Hu, PharmD, BCPS, [facility] Clinical Pharmacist 01/03/2019 4:07 PM

## 2019-01-03 NOTE — Anesthesia Procedure Notes (Signed)
Procedure Name: Intubation Date/Time: 01/03/2019 11:15 AM Performed by: Mariea Clonts, CRNA Pre-anesthesia Checklist: Patient identified, Emergency Drugs available, Suction available and Patient being monitored Patient Re-evaluated:Patient Re-evaluated prior to induction Oxygen Delivery Method: Circle System Utilized Preoxygenation: Pre-oxygenation with 100% oxygen Induction Type: IV induction Ventilation: Mask ventilation without difficulty Laryngoscope Size: Mac and 3 Grade View: Grade II Tube type: Oral Tube size: 7.5 mm Number of attempts: 1 Airway Equipment and Method: Stylet and Oral airway Placement Confirmation: ETT inserted through vocal cords under direct vision,  positive ETCO2 and breath sounds checked- equal and bilateral Tube secured with: Tape Dental Injury: Teeth and Oropharynx as per pre-operative assessment

## 2019-01-03 NOTE — Anesthesia Postprocedure Evaluation (Signed)
Anesthesia Post Note  Patient: Steven Cohen  Procedure(s) Performed: PLIF - L4-L5 - L5-S1 (N/A Back)     Patient location during evaluation: PACU Anesthesia Type: General Level of consciousness: awake and alert Pain management: pain level controlled Vital Signs Assessment: post-procedure vital signs reviewed and stable Respiratory status: spontaneous breathing, nonlabored ventilation, respiratory function stable and patient connected to nasal cannula oxygen Cardiovascular status: blood pressure returned to baseline and stable Postop Assessment: no apparent nausea or vomiting Anesthetic complications: no    Last Vitals:  Vitals:   01/03/19 1530 01/03/19 1605  BP: (!) 165/67 (!) 174/74  Pulse: 70 71  Resp: 10 18  Temp:  36.8 C  SpO2: 100% 98%                  Effie Berkshire

## 2019-01-03 NOTE — Brief Op Note (Signed)
01/03/2019  2:39 PM  PATIENT:  Steven Cohen  68 y.o. male  PRE-OPERATIVE DIAGNOSIS:  Spondylolisthesis  POST-OPERATIVE DIAGNOSIS:  Spondylolisthesis  PROCEDURE:  Procedure(s): PLIF - L4-L5 - L5-S1 (N/A)  SURGEON:  Surgeon(s) and Role:    * Earnie Larsson, MD - Primary  PHYSICIAN ASSISTANT:   ASSISTANTSMearl Latin   ANESTHESIA:   general  EBL:  400 mL   BLOOD ADMINISTERED:none  DRAINS: none   LOCAL MEDICATIONS USED:  MARCAINE     SPECIMEN:  No Specimen  DISPOSITION OF SPECIMEN:  N/A  COUNTS:  YES  TOURNIQUET:  * No tourniquets in log *  DICTATION: .Dragon Dictation  PLAN OF CARE: Admit to inpatient   PATIENT DISPOSITION:  PACU - hemodynamically stable.   Delay start of Pharmacological VTE agent (>24hrs) due to surgical blood loss or risk of bleeding: yes

## 2019-01-03 NOTE — Transfer of Care (Signed)
Immediate Anesthesia Transfer of Care Note  Patient: Steven Cohen  Procedure(s) Performed: PLIF - L4-L5 - L5-S1 (N/A Back)  Patient Location: PACU  Anesthesia Type:General  Level of Consciousness: drowsy  Airway & Oxygen Therapy: Patient Spontanous Breathing and Patient connected to face mask oxygen  Post-op Assessment: Report given to RN and Post -op Vital signs reviewed and stable  Post vital signs: Reviewed  Last Vitals:  Vitals Value Taken Time  BP    Temp    Pulse 72 01/03/19 1505  Resp 10 01/03/19 1505  SpO2 99 % 01/03/19 1505  Vitals shown include unvalidated device data.  Last Pain:  Vitals:   01/03/19 0601  TempSrc:   PainSc: 10-Worst pain ever      Patients Stated Pain Goal: 4 (81/27/51 7001)  Complications: No apparent anesthesia complications. Pt moving extremities x4.

## 2019-01-03 NOTE — H&P (Signed)
Steven Cohen is an 68 y.o. adult.   Chief Complaint: Back pain HPI: 68 year old male with chronic and progressive lower back pain with radiation to both lower extremities.  Symptoms of failed conservative management.  Symptoms become gradually and progressively debilitating.  Work-up demonstrates evidence of degenerative spondylolisthesis with marked foraminal stenosis bilaterally at L5-S1.  Patient also with moderate disc disease and significant facet arthropathy with moderately severe lateral recess stenosis at L4-5.  Patient presents now for 2 level lumbar decompression and fusion in hopes of improving his symptoms.  Past Medical History:  Diagnosis Date  . Arthritis   . Hypertension   . PTSD (post-traumatic stress disorder)     Past Surgical History:  Procedure Laterality Date  . CHOLECYSTECTOMY      History reviewed. No pertinent family history. Social History:  reports that she has never smoked. She has never used smokeless tobacco. She reports current alcohol use. She reports that she does not use drugs.  Allergies:  Allergies  Allergen Reactions  . Aspirin Itching and Rash  . Gabapentin Itching and Rash  . Penicillins Itching and Rash    Did it involve swelling of the face/tongue/throat, SOB, or low BP? No Did it involve sudden or severe rash/hives, skin peeling, or any reaction on the inside of your mouth or nose? No Did you need to seek medical attention at a hospital or doctor's office? No When did it last happen?10-15 years ago If all above answers are "NO", may proceed with cephalosporin use.     Medications Prior to Admission  Medication Sig Dispense Refill  . Cholecalciferol (VITAMIN D3) 10 MCG (400 UNIT) tablet Take 400 Units by mouth once a week.    Marland Kitchen lisinopril (ZESTRIL) 20 MG tablet Take 20 mg by mouth every evening.    . Multiple Vitamins-Minerals (PRESERVISION AREDS 2 PO) Take 1 tablet by mouth 2 (two) times daily.    . Omega-3 Fatty Acids  (FISH OIL) 500 MG CAPS Take 500 mg by mouth every evening.    . THERATEARS 0.25 % SOLN Place 1 drop into both eyes 3 (three) times daily as needed (dry/irritation eyes).    . vitamin C (ASCORBIC ACID) 500 MG tablet Take 500 mg by mouth daily.    . vitamin E 400 UNIT capsule Take 400 Units by mouth once a week.    . methocarbamol (ROBAXIN) 500 MG tablet Take 500 mg by mouth 3 (three) times daily as needed for muscle spasms.      Results for orders placed or performed during the hospital encounter of 01/03/19 (from the past 48 hour(s))  Basic metabolic panel     Status: Abnormal   Collection Time: 01/03/19  6:37 AM  Result Value Ref Range   Sodium 138 135 - 145 mmol/L   Potassium 4.0 3.5 - 5.1 mmol/L   Chloride 106 98 - 111 mmol/L   CO2 21 (L) 22 - 32 mmol/L   Glucose, Bld 112 (H) 70 - 99 mg/dL   BUN 17 8 - 23 mg/dL   Creatinine, Ser 0.96 0.61 - 1.24 mg/dL   Calcium 9.8 8.9 - 10.3 mg/dL   GFR calc non Af Amer >60 >60 mL/min   GFR calc Af Amer >60 >60 mL/min   Anion gap 11 5 - 15    Comment: Performed at [facility], [place] [address], [place], [place] [address]   No results found.  Pertinent items noted in HPI and remainder of comprehensive ROS otherwise negative.  Blood pressure (!) 168/83, pulse 72, temperature 98.7 F (37.1 C), temperature source Oral, resp. rate 18, height 5\' 9"  (1.753 m), weight 83.9 kg, SpO2 99 %.  Patient is awake and alert.  He is oriented and appropriate.  Speech is fluent.  Judgment insight are intact.  Cranial nerve function normal bilaterally.  Motor examination extremities intact bilaterally sensory examination with decrease sensation pinprick light touch in his left L5 dermatome.  Deep tendon focuses normal active except his Achilles reflexes are absent.  No evidence of long track signs.  Gait is antalgic.  Posture is mildly flexed.  Examination head ears eyes nose throat is unremarkable her chest and abdomen are benign. Assessment/Plan L4-5 stenosis  with radiculopathy, L5-S1 degenerative spondylolisthesis, grade 1 with severe foraminal stenosis.  Plan bilateral L4-5 and L5-S1 decompressive laminotomies with foraminotomies followed by posterior lumbar interbody fusion utilizing interbody cages, locally harvested autograft, and augmented with posterior arthrodesis utilizing segmental pedicle screw fixation and local autografting.  Risks and benefits been explained.  Patient wishes to proceed.  A Micky Sheller 01/03/2019, 7:53 AM

## 2019-01-04 MED ORDER — OXYCODONE HCL 10 MG PO TABS: 10.0000 mg | ORAL_TABLET | ORAL | 0 refills | Status: DC | PRN

## 2019-01-04 MED ORDER — POLYETHYLENE GLYCOL 3350 17 G PO PACK: 17.0000 g | PACK | Freq: Every day | ORAL | 0 refills | Status: DC | PRN

## 2019-01-04 MED ORDER — DIAZEPAM 5 MG PO TABS: 5.0000 mg | ORAL_TABLET | Freq: Four times a day (QID) | ORAL | 0 refills | Status: DC | PRN

## 2019-01-04 NOTE — Evaluation (Signed)
Physical Therapy Evaluation Patient Details Name: Steven Cohen MRN: [id number] DOB: 18-Aug-1950 Today's Date: 01/04/2019   History of Present Illness  Pt is a 68 yo male s/p PLIF L4-5, L5-S1. PMHx: arthritis, HTN  Clinical Impression  Pt presented supine in bed with HOB elevated, awake and willing to participate in therapy session. Prior to admission, pt reported that he was independent with all functional mobility and ADLs. Pt lives with his spouse in a two level home with a few steps to enter. He will have necessary support from spouse upon d/c home. At the time of evaluation, pt overall at a supervision level with functional mobility including hallway ambulation without an AD. Pt participated in stair training this session as well without difficulties. PT provided pt education re: back precautions, car transfers and a generalized walking program for pt to initiate upon d/c home. Pt expressed understanding of all. PT answered all questions at end of session. No further acute PT needs identified at this time. PT signing off.     Follow Up Recommendations No PT follow up    Equipment Recommendations  None recommended by PT    Recommendations for Other Services       Precautions / Restrictions Precautions Precautions: Back Precaution Booklet Issued: Yes (comment) Precaution Comments: PT reviewed 3/3 back precautions with pt throughout Required Braces or Orthoses: Spinal Brace Spinal Brace: Lumbar corset;Applied in sitting position Restrictions Weight Bearing Restrictions: No      Mobility  Bed Mobility Overal bed mobility: Needs Assistance Bed Mobility: Rolling;Sidelying to Sit;Sit to Sidelying Rolling: Supervision Sidelying to sit: Supervision     Sit to sidelying: Supervision General bed mobility comments: pt OOB in recliner chair upon arrival  Transfers Overall transfer level: Needs assistance Equipment used: None Transfers: Sit to/from Stand Sit to Stand:  Supervision         General transfer comment: for safety  Ambulation/Gait Ambulation/Gait assistance: Supervision Gait Distance (Feet): 500 Feet Assistive device: None Gait Pattern/deviations: Step-through pattern;Decreased stride length Gait velocity: decreased   General Gait Details: pt with mild instability but no overt LOB or need for physical assistance, supervision for safety  Stairs Stairs: Yes Stairs assistance: Supervision Stair Management: One rail Left;Alternating pattern;Step to pattern;Forwards Number of Stairs: 10 General stair comments: no instability or LOB, no need for physical assistance, use of hand rail  Wheelchair Mobility    Modified Rankin (Stroke Patients Only)       Balance Overall balance assessment: Needs assistance Sitting-balance support: Feet supported Sitting balance-Leahy Scale: Good     Standing balance support: No upper extremity supported Standing balance-Leahy Scale: Fair                               Pertinent Vitals/Pain Pain Assessment: 0-10 Pain Score: 6  Faces Pain Scale: Hurts a little bit Pain Location: back Pain Descriptors / Indicators: Sore Pain Intervention(s): Monitored during session;Repositioned    Home Living Family/patient expects to be discharged to:: Private residence Living Arrangements: Spouse/significant other Available Help at Discharge: Family;Available 24 hours/day Type of Home: House Home Access: Level entry     Home Layout: Two level;Bed/bath upstairs Home Equipment: Walker - 2 wheels;Walker - 4 wheels;Bedside commode;Adaptive equipment      Prior Function Level of Independence: Independent         Comments: Increased time for LB ADL tasks     Hand Dominance   Dominant Hand: Right  Extremity/Trunk Assessment   Upper Extremity Assessment Upper Extremity Assessment: Defer to OT evaluation;Overall WFL for tasks assessed    Lower Extremity Assessment Lower Extremity  Assessment: Overall WFL for tasks assessed    Cervical / Trunk Assessment Cervical / Trunk Assessment: Other exceptions Cervical / Trunk Exceptions: s/p lumbar sx  Communication   Communication: No difficulties  Cognition Arousal/Alertness: Awake/alert Behavior During Therapy: WFL for tasks assessed/performed Overall Cognitive Status: Within Functional Limits for tasks assessed                                        General Comments General comments (skin integrity, edema, etc.): Pt denied need for AE education other than for reacher, LH sponge and LH shoe horn as pt plans to use spouse for assist as needed.    Exercises     Assessment/Plan    PT Assessment Patent does not need any further PT services  PT Problem List         PT Treatment Interventions      PT Goals (Current goals can be found in the Care Plan section)  Acute Rehab PT Goals Patient Stated Goal: "home today" PT Goal Formulation: All assessment and education complete, DC therapy    Frequency     Barriers to discharge        Co-evaluation               AM-PAC PT "6 Clicks" Mobility  Outcome Measure Help needed turning from your back to your side while in a flat bed without using bedrails?: None Help needed moving from lying on your back to sitting on the side of a flat bed without using bedrails?: None Help needed moving to and from a bed to a chair (including a wheelchair)?: None Help needed standing up from a chair using your arms (e.g., wheelchair or bedside chair)?: None Help needed to walk in hospital room?: None Help needed climbing 3-5 steps with a railing? : None 6 Click Score: 24    End of Session Equipment Utilized During Treatment: Back brace Activity Tolerance: Patient tolerated treatment well Patient left: in chair;with call bell/phone within reach;with family/visitor present Nurse Communication: Mobility status PT Visit Diagnosis: Other abnormalities of gait  and mobility (R26.89)    Time: 5397-6734 PT Time Calculation (min) (ACUTE ONLY): 13 min   Charges:   PT Evaluation $PT Eval Low Complexity: 1 Low          Ginette Pitman, PT, DPT  Acute Rehabilitation Services Pager [phone] Office [phone]    Alessandra Bevels Willette Mudry 01/04/2019, 10:23 AM

## 2019-01-04 NOTE — Discharge Instructions (Signed)

## 2019-01-04 NOTE — Progress Notes (Signed)
Patient is discharged from room 3C02 at this time. Alert and in stable condition. IV site d/c'd and instructions read to patient and spouse with understanding verbalized. Left unit via wheelchair with all belongings at side 

## 2019-01-04 NOTE — Evaluation (Signed)
Occupational Therapy Evaluation Patient Details Name: Steven Cohen MRN: [id number] DOB: 1951-01-17 Today's Date: 01/04/2019    History of Present Illness Pt is a 68 yo male s/p PLIF L4-5, L5-S1. PMHx: arthritis, HTN   Clinical Impression   Pt PTA: Pt performing ADL and mobility with modified independence and increased time. Pt currently performing LB ADL tasks with no AE used as pt able to perform with figure four technique. Pt shown AE for reacher, LH sponge and LH shoe horn. Pt's spouse plans to be home to assist as pt does have difficulty with sock donning/doffing, but denied need for sock aid education. Pt donning brace with no difficulty and displaying proper technique.  Back handout provided and reviewed ADL in detail. Pt educated on: clothing between brace, never sleep in brace, set an alarm at night for medication, avoid sitting for long periods of time, correct bed positioning for sleeping, correct sequence for bed mobility, avoiding lifting more than 5 pounds and never wash directly over incision. All education is complete and patient indicates understanding. No further OT needs identified. OT signing off.      Follow Up Recommendations  No OT follow up    Equipment Recommendations  None recommended by OT    Recommendations for Other Services       Precautions / Restrictions Precautions Precautions: Back Precaution Booklet Issued: Yes (comment) Precaution Comments: verbal discussion of back precaution Required Braces or Orthoses: Spinal Brace Spinal Brace: Lumbar corset;Applied in standing position Restrictions Weight Bearing Restrictions: No      Mobility Bed Mobility Overal bed mobility: Needs Assistance Bed Mobility: Rolling;Sidelying to Sit;Sit to Sidelying Rolling: Supervision Sidelying to sit: Supervision     Sit to sidelying: Supervision General bed mobility comments: supervisionA with log roll technique for proper technique  Transfers Overall  transfer level: Needs assistance Equipment used: None Transfers: Sit to/from Stand Sit to Stand: Supervision              Balance Overall balance assessment: No apparent balance deficits (not formally assessed)                                         ADL either performed or assessed with clinical judgement   ADL Overall ADL's : At baseline                                       General ADL Comments: Pt requires assist for donning socks. pt can doff with figure four technique and donn pants/underwear with figure 4 technique. Pt applying brace  with no difficulty and displaying a proper technique. Pt education on back precautions and pt with good carry over skills for LB ADL.     Vision Baseline Vision/History: No visual deficits Vision Assessment?: No apparent visual deficits     Perception     Praxis      Pertinent Vitals/Pain Pain Assessment: Faces Faces Pain Scale: Hurts a little bit Pain Location: incision Pain Descriptors / Indicators: Discomfort;Throbbing Pain Intervention(s): Monitored during session     Hand Dominance Right   Extremity/Trunk Assessment Upper Extremity Assessment Upper Extremity Assessment: Overall WFL for tasks assessed   Lower Extremity Assessment Lower Extremity Assessment: Generalized weakness;Defer to PT evaluation   Cervical / Trunk Assessment Cervical / Trunk Assessment: Other exceptions Cervical / Trunk  Exceptions: s/p lumbar sx   Communication Communication Communication: No difficulties   Cognition Arousal/Alertness: Awake/alert Behavior During Therapy: WFL for tasks assessed/performed Overall Cognitive Status: Within Functional Limits for tasks assessed                                     General Comments  Pt denied need for AE education other than for reacher, LH sponge and LH shoe horn as pt plans to use spouse for assist as needed.    Exercises     Shoulder  Instructions      Home Living Family/patient expects to be discharged to:: Private residence Living Arrangements: Spouse/significant other Available Help at Discharge: Family;Available 24 hours/day Type of Home: House Home Access: Level entry     Home Layout: Two level;Bed/bath upstairs Alternate Level Stairs-Number of Steps: 13   Bathroom Shower/Tub: Producer, television/film/video: Standard     Home Equipment: Environmental consultant - 2 wheels;Walker - 4 wheels;Bedside commode;Adaptive equipment Adaptive Equipment: Reacher        Prior Functioning/Environment Level of Independence: Independent with assistive device(s)        Comments: Increased time for LB ADL tasks        OT Problem List: Decreased activity tolerance      OT Treatment/Interventions:      OT Goals(Current goals can be found in the care plan section) Acute Rehab OT Goals Patient Stated Goal: to go home  OT Frequency:     Barriers to D/C:            Co-evaluation              AM-PAC OT "6 Clicks" Daily Activity     Outcome Measure Help from another person eating meals?: None Help from another person taking care of personal grooming?: None Help from another person toileting, which includes using toliet, bedpan, or urinal?: None Help from another person bathing (including washing, rinsing, drying)?: A Little Help from another person to put on and taking off regular upper body clothing?: None Help from another person to put on and taking off regular lower body clothing?: A Little 6 Click Score: 22   End of Session Equipment Utilized During Treatment: Back brace Nurse Communication: Mobility status  Activity Tolerance: Patient tolerated treatment well Patient left: in chair;with call bell/phone within reach;with family/visitor present  OT Visit Diagnosis: Unsteadiness on feet (R26.81);Muscle weakness (generalized) (M62.81)                Time: 1610-9604 OT Time Calculation (min): 26 min Charges:   OT General Charges $OT Visit: 1 Visit OT Evaluation $OT Eval Moderate Complexity: 1 Mod OT Treatments $Self Care/Home Management : 8-22 mins  Revonda Standard Cecil Cranker) Glendell Docker OTR/L Acute Rehabilitation Services Pager: [phone] Office: [phone]   Lonzo Cloud 01/04/2019, 8:32 AM

## 2019-01-04 NOTE — Discharge Summary (Signed)
Physician Discharge Summary  Patient ID: Steven Cohen MRN: [id number] DOB/AGE: Apr 22, 1950 68 y.o.  Admit date: 01/03/2019 Discharge date: 01/04/2019  Admission Diagnoses: Degenerative spondylolisthesis  Discharge Diagnoses:  Active Problems:   Degenerative spondylolisthesis   Discharged Condition: good  Hospital Course: Patient was admitted following L4-5, L5-S1 posterior lumbar interbody fusion. He is doing well post operatively. He has ambulated with therapies. He is ready to discharge home.  Consults: rehabilitation medicine  Significant Diagnostic Studies: radiology: Dg Lumbar Spine 2-3 Views  Result Date: 01/03/2019 CLINICAL DATA:  PLIF of L4-5 and L5-S1 FLUOROSCOPY TIME:  38 seconds. Images: 2 EXAM: LUMBAR SPINE - 2-3 VIEW COMPARISON:  None. FINDINGS: Pedicle screws been placed at L4, L5, and S1, in good position. Disc spacer devices are seen at L4-5 and L5-S1. There is grade 1 anterolisthesis of L5 versus S1. The disc spacer device at L5-S1 is centered over S1 but near the posterior aspect of L5. It does not appear to extend into the canal. IMPRESSION: Surgical hardware placement as above with pedicle screws and disc spacer devices. Hardware appears to be in adequate position. Electronically Signed   By: Gerome Sam III M.D   On: 01/03/2019 20:35   Dg C-arm 1-60 Min  Result Date: 01/03/2019 CLINICAL DATA:  PLIF of L4-5 and L5-S1 FLUOROSCOPY TIME:  38 seconds. Images: 2 EXAM: LUMBAR SPINE - 2-3 VIEW COMPARISON:  None. FINDINGS: Pedicle screws been placed at L4, L5, and S1, in good position. Disc spacer devices are seen at L4-5 and L5-S1. There is grade 1 anterolisthesis of L5 versus S1. The disc spacer device at L5-S1 is centered over S1 but near the posterior aspect of L5. It does not appear to extend into the canal. IMPRESSION: Surgical hardware placement as above with pedicle screws and disc spacer devices. Hardware appears to be in adequate position. Electronically  Signed   By: Gerome Sam III M.D   On: 01/03/2019 20:36     Treatments: surgery: Bilateral L4-5 and L5-S1 decompressive laminotomies and foraminotomies, more than would be required for simple interbody fusion alone. L5-S1 ponte osteotomies for sagittal plane correction. L4-5 and L5-S1 posterior lumbar interbody fusion utilizing interbody cages ([employer]onic expandable cages) and locally harvested autograft. L4-5 and S1 posterior lateral arthrodesis utilizing segmental pedicle screw ([employer]) fixation and local autograft.  Discharge Exam: Blood pressure (!) 137/96, pulse 81, temperature 98.7 F (37.1 C), temperature source Oral, resp. rate 16, height 5\' 9"  (1.753 m), weight 83.9 kg, SpO2 98 %.  Alert and oriented x 4 PERRLA CN II-XII grossly intact MAE, Strength and sensation intact Incision is covered with Honeycomb dressing and Steri Strips; Dressing is dry and intact, with a small amount of dried sanguinous drainage   Disposition: Discharge disposition: 01-Home or Self Care        Allergies as of 01/04/2019      Reactions   Aspirin Itching, Rash   Gabapentin Itching, Rash   Penicillins Itching, Rash   Did it involve swelling of the face/tongue/throat, SOB, or low BP? No Did it involve sudden or severe rash/hives, skin peeling, or any reaction on the inside of your mouth or nose? No Did you need to seek medical attention at a hospital or doctor's office? No When did it last happen?10-15 years ago If all above answers are "NO", may proceed with cephalosporin use.      Medication List    TAKE these medications   diazepam 5 MG tablet Commonly known as: VALIUM Take 1-2 tablets (  5-10 mg total) by mouth every 6 (six) hours as needed for muscle spasms.   Fish Oil 500 MG Caps Take 500 mg by mouth every evening.   lisinopril 20 MG tablet Commonly known as: ZESTRIL Take 20 mg by mouth every evening.   methocarbamol 500 MG tablet Commonly known as: ROBAXIN Take  500 mg by mouth 3 (three) times daily as needed for muscle spasms.   Oxycodone HCl 10 MG Tabs Take 1 tablet (10 mg total) by mouth every 4 (four) hours as needed for severe pain ((score 7 to 10)).   polyethylene glycol 17 g packet Commonly known as: MIRALAX / GLYCOLAX Take 17 g by mouth daily as needed for mild constipation.   PRESERVISION AREDS 2 PO Take 1 tablet by mouth 2 (two) times daily.   Theratears 0.25 % Soln Generic drug: Carboxymethylcellulose Sodium Place 1 drop into both eyes 3 (three) times daily as needed (dry/irritation eyes).   vitamin C 500 MG tablet Commonly known as: ASCORBIC ACID Take 500 mg by mouth daily.   Vitamin D3 10 MCG (400 UNIT) tablet Take 400 Units by mouth once a week.   vitamin E 400 UNIT capsule Take 400 Units by mouth once a week.            Durable Medical Equipment  (From admission, onward)         Start     Ordered   01/03/19 1557  DME Walker rolling  Once    Question:  Patient needs a walker to treat with the following condition  Answer:  Degenerative spondylolisthesis   01/03/19 1557   01/03/19 1557  DME 3 n 1  Once     01/03/19 1557         Follow-up Information    Earnie Larsson, MD. Schedule an appointment as soon as possible for a visit in 2 week(s).   Specialty: Neurosurgery Contact information: [address] [place] [address] [place] [address] [phone]           Signed: Patricia Nettle 01/04/2019, 8:51 AM

## 2019-01-06 MED FILL — Thrombin (Recombinant) For Soln 20000 Unit: CUTANEOUS | Qty: 1 | Status: AC

## 2019-01-07 MED FILL — Heparin Sodium (Porcine) Inj 1000 Unit/ML: INTRAMUSCULAR | Qty: 30 | Status: AC

## 2019-01-07 MED FILL — Sodium Chloride IV Soln 0.9%: INTRAVENOUS | Qty: 2000 | Status: AC

## 2019-01-14 ENCOUNTER — Encounter: Payer: Self-pay | Admitting: *Deleted

## 2020-09-04 IMAGING — RF DG C-ARM 1-60 MIN
1 series · 2 of 2 positions shown · non-contrast
Comparison: None.

CLINICAL DATA: PLIF of L4-5 and L5-S1

FLUOROSCOPY TIME:  38 seconds.
Images: 2
EXAM:
LUMBAR SPINE - 2-3 VIEW

[Series 1: run · 2 of 2 slices shown]
[im 1/2]
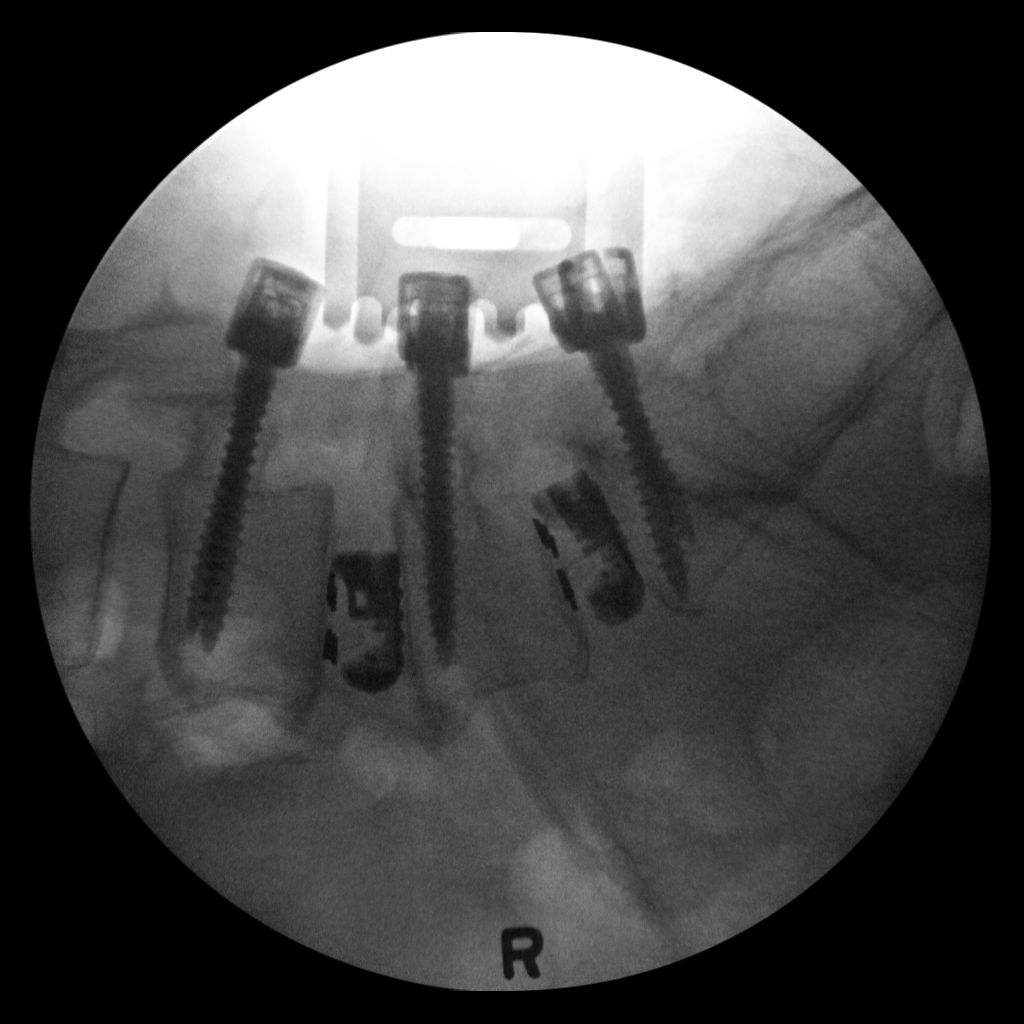
[im 2/2]
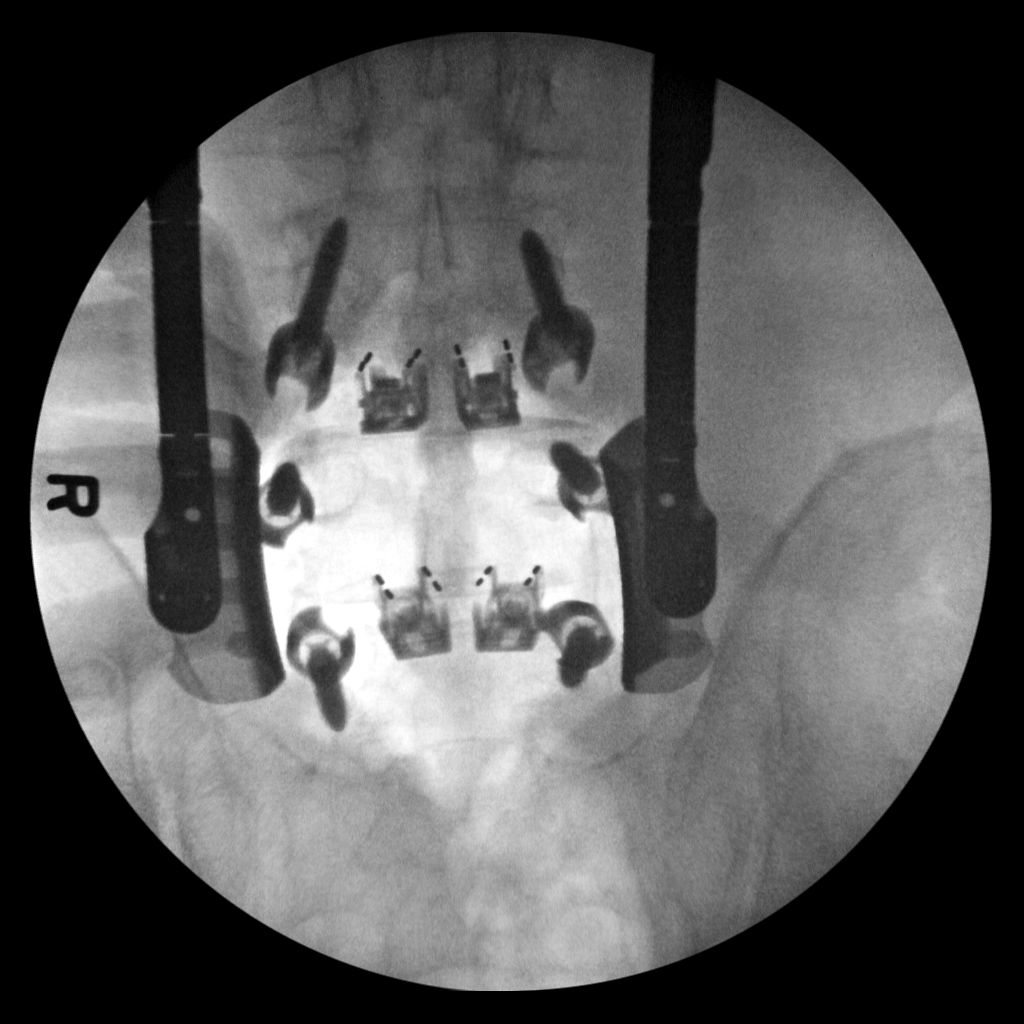

[2 of 2 positions shown; findings below may reference images not displayed]

FINDINGS: Pedicle screws been placed at L4, L5, and S1, in good position. Disc
spacer devices are seen at L4-5 and L5-S1. There is grade 1
anterolisthesis of L5 versus S1. The disc spacer device at L5-S1 is
centered over S1 but near the posterior aspect of L5. It does not
appear to extend into the canal.
IMPRESSION: Surgical hardware placement as above with pedicle screws and disc
spacer devices. Hardware appears to be in adequate position.

## 2022-01-06 ENCOUNTER — Other Ambulatory Visit: Payer: Self-pay | Admitting: Neurosurgery

## 2022-01-13 NOTE — Pre-Procedure Instructions (Signed)
Surgical Instructions    Your procedure is scheduled on January 20, 2022.  Report to [facility] Main Entrance "A" at 11:00 A.M., then check in with the Admitting office.  Call this number if you have problems the morning of surgery:  [phone]   If you have any questions prior to your surgery date call [phone]: Open Monday-Friday 8am-4pm    Remember:  Do not eat or drink after midnight the night before your surgery     Take these medicines the morning of surgery with A SIP OF WATER:  THERATEARS eye drops - may take if needed    As of today, STOP taking any Aspirin (unless otherwise instructed by your surgeon) Aleve, Naproxen, Ibuprofen, Motrin, Advil, Goody's, BC's, all herbal medications, fish oil, and all vitamins.                     Do NOT Smoke (Tobacco/Vaping) for 24 hours prior to your procedure.  If you use a CPAP at night, you may bring your mask/headgear for your overnight stay.   Contacts, glasses, piercing's, hearing aid's, dentures or partials may not be worn into surgery, please bring cases for these belongings.    For patients admitted to the hospital, discharge time will be determined by your treatment team.   Patients discharged the day of surgery will not be allowed to drive home, and someone needs to stay with them for 24 hours.  SURGICAL WAITING ROOM VISITATION Patients having surgery or a procedure may have no more than 2 support people in the waiting area - these visitors may rotate.   Children under the age of 40 must have an adult with them who is not the patient. If the patient needs to stay at the hospital during part of their recovery, the visitor guidelines for inpatient rooms apply. Pre-op nurse will coordinate an appropriate time for 1 support person to accompany patient in pre-op.  This support person may not rotate.   Please refer to the [facility] website for the visitor guidelines for Inpatients (after your surgery is over and you are  in a regular room).    Special instructions:   [place]- Preparing For Surgery  Before surgery, you can play an important role. Because skin is not sterile, your skin needs to be as free of germs as possible. You can reduce the number of germs on your skin by washing with CHG (chlorahexidine gluconate) Soap before surgery.  CHG is an antiseptic cleaner which kills germs and bonds with the skin to continue killing germs even after washing.    Oral Hygiene is also important to reduce your risk of infection.  Remember - BRUSH YOUR TEETH THE MORNING OF SURGERY WITH YOUR REGULAR TOOTHPASTE  Please do not use if you have an allergy to CHG or antibacterial soaps. If your skin becomes reddened/irritated stop using the CHG.  Do not shave (including legs and underarms) for at least 48 hours prior to first CHG shower. It is OK to shave your face.  Please follow these instructions carefully.   Shower the NIGHT BEFORE SURGERY and the MORNING OF SURGERY  If you chose to wash your hair, wash your hair first as usual with your normal shampoo.  After you shampoo, rinse your hair and body thoroughly to remove the shampoo.  Use CHG Soap as you would any other liquid soap. You can apply CHG directly to the skin and wash gently with a scrungie or a clean washcloth.  Apply the CHG Soap to your body ONLY FROM THE NECK DOWN.  Do not use on open wounds or open sores. Avoid contact with your eyes, ears, mouth and genitals (private parts). Wash Face and genitals (private parts)  with your normal soap.   Wash thoroughly, paying special attention to the area where your surgery will be performed.  Thoroughly rinse your body with warm water from the neck down.  DO NOT shower/wash with your normal soap after using and rinsing off the CHG Soap.  Pat yourself dry with a CLEAN TOWEL.  Wear CLEAN PAJAMAS to bed the night before surgery  Place CLEAN SHEETS on your bed the night before your surgery  DO NOT SLEEP  WITH PETS.   Day of Surgery: Take a shower with CHG soap. Do not wear jewelry or makeup Do not wear lotions, powders, perfumes/colognes, or deodorant. Do not shave 48 hours prior to surgery.  Men may shave face and neck. Do not bring valuables to the hospital.  [facility] is not responsible for any belongings or valuables. Do not wear nail polish, gel polish, artificial nails, or any other type of covering on natural nails (fingers and toes) If you have artificial nails or gel coating that need to be removed by a nail salon, please have this removed prior to surgery. Artificial nails or gel coating may interfere with anesthesia's ability to adequately monitor your vital signs.  Wear Clean/Comfortable clothing the morning of surgery Remember to brush your teeth WITH YOUR REGULAR TOOTHPASTE.   Please read over the following fact sheets that you were given.    If you received a COVID test during your pre-op visit  it is requested that you wear a mask when out in public, stay away from anyone that may not be feeling well and notify your surgeon if you develop symptoms. If you have been in contact with anyone that has tested positive in the last 10 days please notify you surgeon.

## 2022-01-16 ENCOUNTER — Encounter (HOSPITAL_COMMUNITY): Payer: Self-pay

## 2022-01-16 ENCOUNTER — Other Ambulatory Visit: Payer: Self-pay

## 2022-01-16 ENCOUNTER — Encounter (HOSPITAL_COMMUNITY)
Admission: RE | Admit: 2022-01-16 | Discharge: 2022-01-16 | Disposition: A | Discharge status: Home / Self Care | Payer: No Typology Code available for payment source | Source: Ambulatory Visit | Attending: Neurosurgery | Admitting: Neurosurgery

## 2022-01-16 VITALS — BP 144/69 | HR 60 | Temp 97.5°F | Resp 18 | Ht 69.0 in | Wt 193.7 lb

## 2022-01-16 DIAGNOSIS — M48061 Spinal stenosis, lumbar region without neurogenic claudication: Secondary | ICD-10-CM | POA: Diagnosis not present

## 2022-01-16 DIAGNOSIS — I2510 Atherosclerotic heart disease of native coronary artery without angina pectoris: Secondary | ICD-10-CM | POA: Diagnosis not present

## 2022-01-16 DIAGNOSIS — I700 Atherosclerosis of aorta: Secondary | ICD-10-CM | POA: Insufficient documentation

## 2022-01-16 DIAGNOSIS — Z01818 Encounter for other preprocedural examination: Secondary | ICD-10-CM | POA: Diagnosis present

## 2022-01-16 DIAGNOSIS — I10 Essential (primary) hypertension: Secondary | ICD-10-CM | POA: Insufficient documentation

## 2022-01-16 LAB — TYPE AND SCREEN
ABO/RH(D): O POS
Antibody Screen: NEGATIVE

## 2022-01-16 LAB — CBC
HCT: 38.5 % — ABNORMAL LOW (ref 39.0–52.0)
Hemoglobin: 12.6 g/dL — ABNORMAL LOW (ref 13.0–17.0)
MCH: 33.2 pg (ref 26.0–34.0)
MCHC: 32.7 g/dL (ref 30.0–36.0)
MCV: 101.6 fL — ABNORMAL HIGH (ref 80.0–100.0)
Platelets: 233 10*3/uL (ref 150–400)
RBC: 3.79 MIL/uL — ABNORMAL LOW (ref 4.22–5.81)
RDW: 12.7 % (ref 11.5–15.5)
WBC: 8.3 10*3/uL (ref 4.0–10.5)
nRBC: 0 % (ref 0.0–0.2)

## 2022-01-16 LAB — BASIC METABOLIC PANEL
Anion gap: 8 (ref 5–15)
BUN: 18 mg/dL (ref 8–23)
CO2: 21 mmol/L — ABNORMAL LOW (ref 22–32)
Calcium: 9.4 mg/dL (ref 8.9–10.3)
Chloride: 104 mmol/L (ref 98–111)
Creatinine, Ser: 1.35 mg/dL — ABNORMAL HIGH (ref 0.61–1.24)
GFR, Estimated: 55 mL/min — ABNORMAL LOW (ref >60)
Glucose, Bld: 106 mg/dL — ABNORMAL HIGH (ref 70–99)
Potassium: 5.1 mmol/L (ref 3.5–5.1)
Sodium: 133 mmol/L — ABNORMAL LOW (ref 135–145)

## 2022-01-16 LAB — SURGICAL PCR SCREEN
MRSA, PCR: NEGATIVE
Staphylococcus aureus: NEGATIVE

## 2022-01-16 NOTE — Progress Notes (Signed)
Anesthesia APP Evaluation:  Case: [id number] Date/Time: 01/20/22 1253   Procedure: PLIF - Posterior Lateral and Interbody fusion - L3-L4 (Back)   Anesthesia type: General   Pre-op diagnosis: Stenosis   Location: [facility] OR ROOM 32 / [place] [facility]   Surgeons: Earnie Larsson, MD       DISCUSSION: Patient is a 71 year old male scheduled for the above procedure.  History includes never smoker, HTN, PTSD, cholecystectomy, spinal surgery (L4-S1 PLIF/posterolateral arthrodesis 01/03/19).  I evaluated him during his PAT visit due to questionable history of murmur.  He reported an echocardiogram done within the past 2 years that was ordered by primary care.  He was not told of any abnormalities that needed ongoing follow-up.  He denies chest pain, shortness of breath, edema, syncope, palpitations, orthopnea.  He began having debilitating back pain around July 2023.  Prior to that he denied any activity limitations. He says historically he stays active and on the go. Until his back pain, he had been doing yard work including mowing and was involved in cars and automotive shows. Currently he is using a hospital wheelchair.   I did not appreciate a murmur on exam. His last echo at the [facility], likely from 11/2020, but awaiting copy to confirm date, showed  LVEF 54%, no RWMA, grade 1 DD, mild-moderate aortic sclerosis without AS or AR, trace-mild MR, mild pulmonic stenosis (peak pulmonic valve gradient = 26 mmHg and peak pulmonic valve velocity = 257 cm/s).  Reported activity tolerance > 4 METS prior to back pain in July 2023. EKG showed SB with sinus arrhythmia. He denied CV symptoms. Awaiting records from [facility].  ADDENDUM 01/19/22 4:14 PM: I did receive a copy of his echo done at the [facility] and confirmed it was done on 12/28/20. He had overall only mild valvular disease on an echo from just over a year ago. As mentioned, recent activity tolerance > 4 METS and without CV/HF symptoms. Anesthesia team to evaluate on the day of  surgery. Of note, I did discuss with Steven Cohen that with mild valvular disease he may need follow-up echo within the next few years or sooner if change in symptoms, but encouraged him to clarify with his PCP who ordered the initial echocardiogram.   VS: BP (!) 144/69   Pulse 60   Temp (!) 36.4 C (Oral)   Resp 18   Ht _0  (1.753 m)   Wt 87.9 kg   SpO2 100%   BMI 28.60 kg/m  Provider wore a face mask. Heart RRR. No murmur noted. Lungs clear. No ankle edema.    PROVIDERS: He receives primary care through the [facility]   LABS: Preoperative labs noted. A1c 6.1%, alk phos 71, AST 14, ALT 27, Cr 1.09, BUN 15, eGFR 72 02/28/21 ([facility]). Renal comparison labs from 02/28/21 show Cr 1.09, BUN 15, eGFR 72 and on 08/31/20 Cr 1.30, BUN 21, eGFR 58 ([facility]).  (all labs ordered are listed, but only abnormal results are displayed)  Labs Reviewed  BASIC METABOLIC PANEL - Abnormal; Notable for the following components:      Result Value   Sodium 133 (*)    CO2 21 (*)    Glucose, Bld 106 (*)    Creatinine, Ser 1.35 (*)    GFR, Estimated 55 (*)    All other components within normal limits  CBC - Abnormal; Notable for the following components:   RBC 3.79 (*)    Hemoglobin 12.6 (*)    HCT 38.5 (*)  MCV 101.6 (*)    All other components within normal limits  SURGICAL PCR SCREEN  TYPE AND SCREEN    IMAGES: MRI L-spine 09/29/21 [facility]): Impression: 1. Postsurgical and degenerative findings in the lumbar spine as described. Findings at L3-L4 have significantly worsened since  2019. 2. 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 3. In the lower right L4 lateral recess just superior to the L4-L5 disc space level, there is a small extradural structure measuring 6 to 7 mm (series 801 image 16) suspicious for sequestered disc/free disc fragment.    EKG: 01/16/22: Sinus bradycardia at 59 bpm with Sinus arrhythmia Otherwise normal ECG   CV:   Echo 12/28/20 ([facility]):  Indication: II/VI Systolic Murmur RUSB Summary: The left ventricle is normal in size.There is mild concentric left ventricular  hypertrophy.Left ventricular systolic function is normal.EF= 54% by 2D biplane method.It was a technically difficult  study, IV contrast was given to opacify LV cavity, No definite regional wall motion abnormalities noted.Grade I  diastolic dysfunction, (abnormal relaxation pattern). The right ventricle is normal in size and function. The left atrium is mildly dilated.Left atrial volume index is 22 mL/m2. Right atrial size is normal. The aortic valve is trileaflet.There is mild to moderate aortic sclerosis.No  hemodynamically significant valvular aortic stenosis.or aortic regurgitation. The pulmonic valve is not well visualized but there appears to be mild pulmonic  valve thickening with mild PS, peak pulmonic valve gradient = 26 mmHg and peak pulmonic valve velocity = 257 cm/s There is mild mitral annular calcification.There is trace to mild mitral  regurgitation. The tricuspid valve is grossly normal. The aortic root is normal size. There is no pericardial effusion.   Past Medical History:  Diagnosis Date   Arthritis    Hypertension    PTSD (post-traumatic stress disorder)     Past Surgical History:  Procedure Laterality Date   CHOLECYSTECTOMY      MEDICATIONS:  ammonium lactate (AMLACTIN) 12 % cream   ARTIFICIAL TEAR OINTMENT OP   Cholecalciferol (VITAMIN D3) 10 MCG (400 UNIT) tablet   Coenzyme Q10-Vitamin E (QUNOL ULTRA COQ10 PO)   diclofenac Sodium (VOLTAREN ARTHRITIS PAIN) 1 % GEL   ibuprofen (ADVIL) 200 MG tablet   lisinopril (ZESTRIL) 40 MG tablet   MAGNESIUM PO   methocarbamol (ROBAXIN) 500 MG tablet   Multiple Vitamins-Minerals (PRESERVISION AREDS 2 PO)   THERATEARS 0.25 % SOLN   TURMERIC PO   vitamin C (ASCORBIC ACID) 500 MG tablet   vitamin E 400 UNIT capsule   No current  facility-administered medications for this encounter.    Steven Gianotti, PA-C Surgical Short Stay/Anesthesiology [facility] Phone [phone] [facility] Phone [phone] 01/16/2022 4:51 PM

## 2022-01-16 NOTE — Progress Notes (Signed)
[facility] - [facility] Cardiologist - [facility]  PPM/ICD - Denies Device Orders - n/a Rep Notified - n/a  Chest x-ray - n/a EKG - 01/16/2022 Stress Test - Denies ECHO - Per pt, had one 1-2 years ago at [place]. Records requested Cardiac Cath - Denies  Sleep Study - Denies CPAP - n/a  No DM  Last dose of GLP1 agonist- n/a GLP1 instructions: n/a  Blood Thinner Instructions: n/a Aspirin Instructions: n/a  NPO after midnight  COVID TEST- n/a   Anesthesia review: Yes. Echocardiogram results requested. Discussed with Shonna Chock, PA-C during PAT appointment  Patient denies shortness of breath, fever, cough and chest pain at PAT appointment   All instructions explained to the patient, with a verbal understanding of the material. Patient agrees to go over the instructions while at home for a better understanding. Patient also instructed to self quarantine after being tested for COVID-19. The opportunity to ask questions was provided.

## 2022-01-19 NOTE — Anesthesia Preprocedure Evaluation (Addendum)
Anesthesia Evaluation  Patient identified by MRN, date of birth, ID band Patient awake    Reviewed: Allergy & Precautions, H&P , NPO status , Patient's Chart, lab work & pertinent test results  Airway Mallampati: II  TM Distance: >3 FB Neck ROM: Full    Dental no notable dental hx.    Pulmonary neg pulmonary ROS   Pulmonary exam normal breath sounds clear to auscultation       Cardiovascular hypertension, Normal cardiovascular exam Rhythm:Regular Rate:Normal  Echo 12/28/20 ([facility]):  Indication: II/VI Systolic Murmur RUSB Summary: The left ventricle is normal in size.There is mild concentric left ventricular  hypertrophy.Left ventricular systolic function is normal.EF= 54% by 2D biplane method.It was a technically difficult  study, IV contrast was given to opacify LV cavity, No definite regional wall motion abnormalities noted.Grade I  diastolic dysfunction, (abnormal relaxation pattern). The right ventricle is normal in size and function. The left atrium is mildly dilated.Left atrial volume index is 22 mL/m2. Right atrial size is normal. The aortic valve is trileaflet.There is mild to moderate aortic sclerosis.No  hemodynamically significant valvular aortic stenosis.or aortic regurgitation. The pulmonic valve is not well visualized but there appears to be mild pulmonic  valve thickening with mild PS, peak pulmonic valve gradient = 26 mmHg and peak pulmonic valve velocity = 257 cm/s There is mild mitral annular calcification.There is trace to mild mitral  regurgitation. The tricuspid valve is grossly normal. The aortic root is normal size. There is no pericardial effusion.    Neuro/Psych negative neurological ROS  negative psych ROS   GI/Hepatic negative GI ROS, Neg liver ROS,,,  Endo/Other  negative endocrine ROS    Renal/GU negative Renal ROS  negative genitourinary   Musculoskeletal negative  musculoskeletal ROS (+)    Abdominal   Peds negative pediatric ROS (+)  Hematology negative hematology ROS (+)   Anesthesia Other Findings   Reproductive/Obstetrics negative OB ROS                             Anesthesia Physical Anesthesia Plan  ASA: 2  Anesthesia Plan: General   Post-op Pain Management: Ofirmev IV (intra-op)*   Induction: Intravenous  PONV Risk Score and Plan: 2 and Ondansetron, Dexamethasone and Treatment may vary due to age or medical condition  Airway Management Planned: Oral ETT  Additional Equipment:   Intra-op Plan:   Post-operative Plan: Extubation in OR  Informed Consent: I have reviewed the patients History and Physical, chart, labs and discussed the procedure including the risks, benefits and alternatives for the proposed anesthesia with the patient or authorized representative who has indicated his/her understanding and acceptance.     Dental advisory given  Plan Discussed with: CRNA and Surgeon  Anesthesia Plan Comments: (PAT note written by Shonna Chock, PA-C.  )       Anesthesia Quick Evaluation

## 2022-01-20 ENCOUNTER — Other Ambulatory Visit: Payer: Self-pay

## 2022-01-20 ENCOUNTER — Encounter (HOSPITAL_COMMUNITY): Payer: Self-pay | Admitting: Neurosurgery

## 2022-01-20 ENCOUNTER — Ambulatory Visit (HOSPITAL_COMMUNITY): Payer: No Typology Code available for payment source

## 2022-01-20 ENCOUNTER — Ambulatory Visit (HOSPITAL_BASED_OUTPATIENT_CLINIC_OR_DEPARTMENT_OTHER): Payer: No Typology Code available for payment source | Admitting: Anesthesiology

## 2022-01-20 ENCOUNTER — Observation Stay (HOSPITAL_COMMUNITY)
Admission: RE | Admit: 2022-01-20 | Discharge: 2022-01-21 | Disposition: A | Discharge status: Home / Self Care | Payer: No Typology Code available for payment source | Attending: Neurosurgery | Admitting: Neurosurgery

## 2022-01-20 ENCOUNTER — Ambulatory Visit (HOSPITAL_COMMUNITY): Payer: No Typology Code available for payment source | Admitting: Vascular Surgery

## 2022-01-20 ENCOUNTER — Ambulatory Visit (HOSPITAL_COMMUNITY)
Admission: RE | Disposition: A | Discharge status: Home / Self Care | Payer: Self-pay | Source: Home / Self Care | Attending: Neurosurgery

## 2022-01-20 DIAGNOSIS — Z79899 Other long term (current) drug therapy: Secondary | ICD-10-CM | POA: Diagnosis not present

## 2022-01-20 DIAGNOSIS — I10 Essential (primary) hypertension: Secondary | ICD-10-CM | POA: Diagnosis not present

## 2022-01-20 DIAGNOSIS — M5416 Radiculopathy, lumbar region: Secondary | ICD-10-CM | POA: Insufficient documentation

## 2022-01-20 DIAGNOSIS — M48061 Spinal stenosis, lumbar region without neurogenic claudication: Principal | ICD-10-CM | POA: Diagnosis present

## 2022-01-20 DIAGNOSIS — M5136 Other intervertebral disc degeneration, lumbar region: Secondary | ICD-10-CM | POA: Diagnosis present

## 2022-01-20 SURGERY — POSTERIOR LUMBAR FUSION 1 LEVEL
Anesthesia: General | Site: Back

## 2022-01-20 MED ORDER — VANCOMYCIN HCL 1000 MG IV SOLR
INTRAVENOUS | Status: AC
  Filled 2022-01-20: qty 20

## 2022-01-20 MED ORDER — HYDROMORPHONE HCL 1 MG/ML IJ SOLN
INTRAMUSCULAR | Status: AC
  Filled 2022-01-20: qty 1

## 2022-01-20 MED ORDER — LISINOPRIL 20 MG PO TABS
40.0000 mg | ORAL_TABLET | Freq: Every day | ORAL | Status: DC
  Administered 2022-01-20: 40 mg via ORAL
  Filled 2022-01-20: qty 2

## 2022-01-20 MED ORDER — OXYCODONE HCL 5 MG/5ML PO SOLN: 5.0000 mg | Freq: Once | ORAL | Status: DC | PRN

## 2022-01-20 MED ORDER — VITAMIN E 45 MG (100 UNIT) PO CAPS
400.0000 [IU] | ORAL_CAPSULE | ORAL | Status: DC
  Administered 2022-01-20: 400 [IU] via ORAL
  Filled 2022-01-20: qty 4

## 2022-01-20 MED ORDER — FLEET ENEMA 7-19 GM/118ML RE ENEM: 1.0000 | ENEMA | Freq: Once | RECTAL | Status: DC | PRN

## 2022-01-20 MED ORDER — ONDANSETRON HCL 4 MG/2ML IJ SOLN: 4.0000 mg | Freq: Once | INTRAMUSCULAR | Status: DC | PRN

## 2022-01-20 MED ORDER — LACTATED RINGERS IV SOLN: INTRAVENOUS | Status: DC

## 2022-01-20 MED ORDER — LIDOCAINE 2% (20 MG/ML) 5 ML SYRINGE
INTRAMUSCULAR | Status: DC | PRN
  Administered 2022-01-20: 100 mg via INTRAVENOUS

## 2022-01-20 MED ORDER — PHENYLEPHRINE HCL-NACL 20-0.9 MG/250ML-% IV SOLN
INTRAVENOUS | Status: DC | PRN
  Administered 2022-01-20: 50 ug/min via INTRAVENOUS

## 2022-01-20 MED ORDER — THROMBIN 20000 UNITS EX SOLR
CUTANEOUS | Status: AC
  Filled 2022-01-20: qty 20000

## 2022-01-20 MED ORDER — BUPIVACAINE HCL (PF) 0.25 % IJ SOLN
INTRAMUSCULAR | Status: AC
  Filled 2022-01-20: qty 30

## 2022-01-20 MED ORDER — ONDANSETRON HCL 4 MG/2ML IJ SOLN
4.0000 mg | Freq: Four times a day (QID) | INTRAMUSCULAR | Status: DC | PRN
  Filled 2022-01-20: qty 2

## 2022-01-20 MED ORDER — SODIUM CHLORIDE 0.9% FLUSH: 3.0000 mL | Freq: Two times a day (BID) | INTRAVENOUS | Status: DC

## 2022-01-20 MED ORDER — VANCOMYCIN HCL 1500 MG/300ML IV SOLN
1500.0000 mg | INTRAVENOUS | Status: DC
  Administered 2022-01-20: 1500 mg via INTRAVENOUS
  Filled 2022-01-20 (×2): qty 300

## 2022-01-20 MED ORDER — ROCURONIUM BROMIDE 10 MG/ML (PF) SYRINGE
PREFILLED_SYRINGE | INTRAVENOUS | Status: DC | PRN
  Administered 2022-01-20: 70 mg via INTRAVENOUS
  Administered 2022-01-20: 20 mg via INTRAVENOUS
  Administered 2022-01-20: 30 mg via INTRAVENOUS

## 2022-01-20 MED ORDER — CHOLECALCIFEROL 10 MCG (400 UNIT) PO TABS
400.0000 [IU] | ORAL_TABLET | ORAL | Status: DC
  Filled 2022-01-20: qty 1

## 2022-01-20 MED ORDER — ORAL CARE MOUTH RINSE: 15.0000 mL | Freq: Once | OROMUCOSAL | Status: AC

## 2022-01-20 MED ORDER — PROPOFOL 10 MG/ML IV BOLUS
INTRAVENOUS | Status: DC | PRN
  Administered 2022-01-20: 120 mg via INTRAVENOUS

## 2022-01-20 MED ORDER — BUPIVACAINE HCL (PF) 0.25 % IJ SOLN
INTRAMUSCULAR | Status: DC | PRN
  Administered 2022-01-20: 20 mL

## 2022-01-20 MED ORDER — VANCOMYCIN HCL 1000 MG IV SOLR
INTRAVENOUS | Status: DC | PRN
  Administered 2022-01-20: 1000 mg via TOPICAL

## 2022-01-20 MED ORDER — CHLORHEXIDINE GLUCONATE CLOTH 2 % EX PADS: 6.0000 | MEDICATED_PAD | Freq: Once | CUTANEOUS | Status: DC

## 2022-01-20 MED ORDER — DEXAMETHASONE SODIUM PHOSPHATE 10 MG/ML IJ SOLN
INTRAMUSCULAR | Status: DC | PRN
  Administered 2022-01-20: 10 mg via INTRAVENOUS

## 2022-01-20 MED ORDER — POLYVINYL ALCOHOL 1.4 % OP SOLN: 1.0000 [drp] | Freq: Three times a day (TID) | OPHTHALMIC | Status: DC | PRN

## 2022-01-20 MED ORDER — FENTANYL CITRATE (PF) 250 MCG/5ML IJ SOLN
INTRAMUSCULAR | Status: DC | PRN
  Administered 2022-01-20 (×2): 50 ug via INTRAVENOUS
  Administered 2022-01-20: 100 ug via INTRAVENOUS
  Administered 2022-01-20: 50 ug via INTRAVENOUS

## 2022-01-20 MED ORDER — TURMERIC 500 MG PO TABS: ORAL_TABLET | Freq: Every day | ORAL | Status: DC

## 2022-01-20 MED ORDER — SUGAMMADEX SODIUM 200 MG/2ML IV SOLN
INTRAVENOUS | Status: DC | PRN
  Administered 2022-01-20: 200 mg via INTRAVENOUS

## 2022-01-20 MED ORDER — PROPOFOL 10 MG/ML IV BOLUS
INTRAVENOUS | Status: AC
  Filled 2022-01-20: qty 20

## 2022-01-20 MED ORDER — SODIUM CHLORIDE 0.9 % IV SOLN: 250.0000 mL | INTRAVENOUS | Status: DC

## 2022-01-20 MED ORDER — PROSIGHT PO TABS
1.0000 | ORAL_TABLET | Freq: Every day | ORAL | Status: DC
  Administered 2022-01-20 – 2022-01-21 (×2): 1 via ORAL
  Filled 2022-01-20 (×2): qty 1

## 2022-01-20 MED ORDER — ACETAMINOPHEN 650 MG RE SUPP: 650.0000 mg | RECTAL | Status: DC | PRN

## 2022-01-20 MED ORDER — OXYCODONE HCL 5 MG PO TABS: 5.0000 mg | ORAL_TABLET | Freq: Once | ORAL | Status: DC | PRN

## 2022-01-20 MED ORDER — MAGNESIUM OXIDE -MG SUPPLEMENT 400 (240 MG) MG PO TABS
200.0000 mg | ORAL_TABLET | Freq: Every day | ORAL | Status: DC
  Administered 2022-01-20: 200 mg via ORAL
  Filled 2022-01-20: qty 1

## 2022-01-20 MED ORDER — HYDROCODONE-ACETAMINOPHEN 10-325 MG PO TABS: 1.0000 | ORAL_TABLET | ORAL | Status: DC | PRN

## 2022-01-20 MED ORDER — PHENYLEPHRINE 80 MCG/ML (10ML) SYRINGE FOR IV PUSH (FOR BLOOD PRESSURE SUPPORT)
PREFILLED_SYRINGE | INTRAVENOUS | Status: DC | PRN
  Administered 2022-01-20: 160 ug via INTRAVENOUS

## 2022-01-20 MED ORDER — ACETAMINOPHEN 325 MG PO TABS: 650.0000 mg | ORAL_TABLET | ORAL | Status: DC | PRN

## 2022-01-20 MED ORDER — ACETAMINOPHEN 10 MG/ML IV SOLN
INTRAVENOUS | Status: AC
  Filled 2022-01-20: qty 100

## 2022-01-20 MED ORDER — PHENOL 1.4 % MT LIQD: 1.0000 | OROMUCOSAL | Status: DC | PRN

## 2022-01-20 MED ORDER — ACETAMINOPHEN 10 MG/ML IV SOLN
INTRAVENOUS | Status: DC | PRN
  Administered 2022-01-20: 1000 mg via INTRAVENOUS

## 2022-01-20 MED ORDER — FENTANYL CITRATE (PF) 250 MCG/5ML IJ SOLN
INTRAMUSCULAR | Status: AC
  Filled 2022-01-20: qty 5

## 2022-01-20 MED ORDER — MIDAZOLAM HCL 2 MG/2ML IJ SOLN
INTRAMUSCULAR | Status: AC
  Filled 2022-01-20: qty 2

## 2022-01-20 MED ORDER — METHOCARBAMOL 500 MG PO TABS
500.0000 mg | ORAL_TABLET | Freq: Four times a day (QID) | ORAL | Status: DC | PRN
  Administered 2022-01-20 – 2022-01-21 (×2): 500 mg via ORAL
  Filled 2022-01-20 (×2): qty 1

## 2022-01-20 MED ORDER — OXYCODONE HCL 5 MG PO TABS
10.0000 mg | ORAL_TABLET | ORAL | Status: DC | PRN
  Administered 2022-01-20 – 2022-01-21 (×4): 10 mg via ORAL
  Filled 2022-01-20 (×4): qty 2

## 2022-01-20 MED ORDER — BISACODYL 10 MG RE SUPP: 10.0000 mg | Freq: Every day | RECTAL | Status: DC | PRN

## 2022-01-20 MED ORDER — ONDANSETRON HCL 4 MG/2ML IJ SOLN
INTRAMUSCULAR | Status: DC | PRN
  Administered 2022-01-20: 4 mg via INTRAVENOUS

## 2022-01-20 MED ORDER — VITAMIN C 500 MG PO TABS
500.0000 mg | ORAL_TABLET | Freq: Every day | ORAL | Status: DC
  Administered 2022-01-20 – 2022-01-21 (×2): 500 mg via ORAL
  Filled 2022-01-20 (×2): qty 1

## 2022-01-20 MED ORDER — ONDANSETRON HCL 4 MG PO TABS: 4.0000 mg | ORAL_TABLET | Freq: Four times a day (QID) | ORAL | Status: DC | PRN

## 2022-01-20 MED ORDER — POLYETHYLENE GLYCOL 3350 17 G PO PACK: 17.0000 g | PACK | Freq: Every day | ORAL | Status: DC | PRN

## 2022-01-20 MED ORDER — CHLORHEXIDINE GLUCONATE 0.12 % MT SOLN
OROMUCOSAL | Status: AC
  Administered 2022-01-20: 15 mL via OROMUCOSAL
  Filled 2022-01-20: qty 15

## 2022-01-20 MED ORDER — MENTHOL 3 MG MT LOZG: 1.0000 | LOZENGE | OROMUCOSAL | Status: DC | PRN

## 2022-01-20 MED ORDER — COENZYME Q10-VITAMIN E 100-150 MG-UNIT PO CAPS: ORAL_CAPSULE | Freq: Every day | ORAL | Status: DC

## 2022-01-20 MED ORDER — SODIUM CHLORIDE 0.9% FLUSH: 3.0000 mL | INTRAVENOUS | Status: DC | PRN

## 2022-01-20 MED ORDER — CHLORHEXIDINE GLUCONATE 0.12 % MT SOLN: 15.0000 mL | Freq: Once | OROMUCOSAL | Status: AC

## 2022-01-20 MED ORDER — THROMBIN 20000 UNITS EX SOLR
CUTANEOUS | Status: DC | PRN
  Administered 2022-01-20: 20 mL via TOPICAL

## 2022-01-20 MED ORDER — HYDROMORPHONE HCL 1 MG/ML IJ SOLN: 1.0000 mg | INTRAMUSCULAR | Status: DC | PRN

## 2022-01-20 MED ORDER — AMISULPRIDE (ANTIEMETIC) 5 MG/2ML IV SOLN
INTRAVENOUS | Status: AC
  Administered 2022-01-20: 10 mg via INTRAVENOUS
  Filled 2022-01-20: qty 4

## 2022-01-20 MED ORDER — HYDROMORPHONE HCL 1 MG/ML IJ SOLN
0.2500 mg | INTRAMUSCULAR | Status: DC | PRN
  Administered 2022-01-20 (×3): 0.5 mg via INTRAVENOUS

## 2022-01-20 MED ORDER — DIAZEPAM 5 MG PO TABS: 5.0000 mg | ORAL_TABLET | Freq: Four times a day (QID) | ORAL | Status: DC | PRN

## 2022-01-20 MED ORDER — 0.9 % SODIUM CHLORIDE (POUR BTL) OPTIME
TOPICAL | Status: DC | PRN
  Administered 2022-01-20: 1000 mL

## 2022-01-20 MED ORDER — VANCOMYCIN HCL IN DEXTROSE 1-5 GM/200ML-% IV SOLN
1000.0000 mg | INTRAVENOUS | Status: AC
  Administered 2022-01-20: 1000 mg via INTRAVENOUS
  Filled 2022-01-20: qty 200

## 2022-01-20 MED ORDER — MIDAZOLAM HCL 2 MG/2ML IJ SOLN
INTRAMUSCULAR | Status: DC | PRN
  Administered 2022-01-20: 2 mg via INTRAVENOUS

## 2022-01-20 MED ORDER — AMISULPRIDE (ANTIEMETIC) 5 MG/2ML IV SOLN: 10.0000 mg | Freq: Once | INTRAVENOUS | Status: AC

## 2022-01-20 SURGICAL SUPPLY — 58 items
BAG COUNTER SPONGE SURGICOUNT (BAG) ×1 IMPLANT
BAG DECANTER FOR FLEXI CONT (MISCELLANEOUS) ×1 IMPLANT
BENZOIN TINCTURE PRP APPL 2/3 (GAUZE/BANDAGES/DRESSINGS) ×1 IMPLANT
BLADE BONE MILL MEDIUM (MISCELLANEOUS) ×1 IMPLANT
BLADE CLIPPER SURG (BLADE) IMPLANT
BONE GRAFTON DBF INJECT 6CC (Bone Implant) IMPLANT
BUR CUTTER 7.0 ROUND (BURR) IMPLANT
BUR MATCHSTICK NEURO 3.0 LAGG (BURR) ×1 IMPLANT
CAGE EXP CATALYFT 9 (Plate) IMPLANT
CANISTER SUCT 3000ML PPV (MISCELLANEOUS) ×1 IMPLANT
CAP LCK SPNE (Orthopedic Implant) ×4 IMPLANT
CAP LOCK SPINE RADIUS (Orthopedic Implant) IMPLANT
CAP LOCKING (Orthopedic Implant) ×4 IMPLANT
CNTNR URN SCR LID CUP LEK RST (MISCELLANEOUS) ×1 IMPLANT
CONT SPEC 4OZ STRL OR WHT (MISCELLANEOUS) ×1
COVER BACK TABLE 60X90IN (DRAPES) ×1 IMPLANT
DERMABOND ADVANCED .7 DNX12 (GAUZE/BANDAGES/DRESSINGS) ×1 IMPLANT
DRAPE C-ARM 42X72 X-RAY (DRAPES) ×2 IMPLANT
DRAPE HALF SHEET 40X57 (DRAPES) IMPLANT
DRAPE LAPAROTOMY 100X72X124 (DRAPES) ×1 IMPLANT
DRAPE SURG 17X23 STRL (DRAPES) ×4 IMPLANT
DRSG OPSITE POSTOP 4X6 (GAUZE/BANDAGES/DRESSINGS) ×1 IMPLANT
DRSG OPSITE POSTOP 4X8 (GAUZE/BANDAGES/DRESSINGS) IMPLANT
DURAPREP 26ML APPLICATOR (WOUND CARE) ×1 IMPLANT
ELECT REM PT RETURN 9FT ADLT (ELECTROSURGICAL) ×1
ELECTRODE REM PT RTRN 9FT ADLT (ELECTROSURGICAL) ×1 IMPLANT
EVACUATOR 1/8 PVC DRAIN (DRAIN) IMPLANT
GAUZE 4X4 16PLY ~~LOC~~+RFID DBL (SPONGE) IMPLANT
GAUZE SPONGE 4X4 12PLY STRL (GAUZE/BANDAGES/DRESSINGS) IMPLANT
GLOVE BIO SURGEON STRL SZ 6.5 (GLOVE) ×1 IMPLANT
GLOVE BIOGEL PI IND STRL 6.5 (GLOVE) ×1 IMPLANT
GLOVE ECLIPSE 9.0 STRL (GLOVE) ×2 IMPLANT
GLOVE EXAM NITRILE XL STR (GLOVE) IMPLANT
GOWN STRL REUS W/ TWL LRG LVL3 (GOWN DISPOSABLE) IMPLANT
GOWN STRL REUS W/ TWL XL LVL3 (GOWN DISPOSABLE) ×2 IMPLANT
GOWN STRL REUS W/TWL 2XL LVL3 (GOWN DISPOSABLE) IMPLANT
GOWN STRL REUS W/TWL LRG LVL3 (GOWN DISPOSABLE)
GOWN STRL REUS W/TWL XL LVL3 (GOWN DISPOSABLE) ×2
KIT BASIN OR (CUSTOM PROCEDURE TRAY) ×1 IMPLANT
KIT TURNOVER KIT B (KITS) ×1 IMPLANT
MILL MEDIUM DISP (BLADE) ×1 IMPLANT
NEEDLE HYPO 22GX1.5 SAFETY (NEEDLE) ×1 IMPLANT
NS IRRIG 1000ML POUR BTL (IV SOLUTION) ×1 IMPLANT
PACK LAMINECTOMY NEURO (CUSTOM PROCEDURE TRAY) ×1 IMPLANT
ROD 50MM (Rod) ×2 IMPLANT
ROD SPNL 50X5.5XNS TI RDS (Rod) IMPLANT
SCREW 5.75X45MM (Screw) IMPLANT
SPIKE FLUID TRANSFER (MISCELLANEOUS) ×1 IMPLANT
SPONGE SURGIFOAM ABS GEL 100 (HEMOSTASIS) ×1 IMPLANT
STRIP CLOSURE SKIN 1/2X4 (GAUZE/BANDAGES/DRESSINGS) ×2 IMPLANT
SUT VIC AB 0 CT1 18XCR BRD8 (SUTURE) ×2 IMPLANT
SUT VIC AB 0 CT1 8-18 (SUTURE) ×1
SUT VIC AB 2-0 CT1 18 (SUTURE) ×1 IMPLANT
SUT VIC AB 3-0 SH 8-18 (SUTURE) ×2 IMPLANT
TOWEL GREEN STERILE (TOWEL DISPOSABLE) ×1 IMPLANT
TOWEL GREEN STERILE FF (TOWEL DISPOSABLE) ×1 IMPLANT
TRAY FOLEY MTR SLVR 16FR STAT (SET/KITS/TRAYS/PACK) ×1 IMPLANT
WATER STERILE IRR 1000ML POUR (IV SOLUTION) ×1 IMPLANT

## 2022-01-20 NOTE — H&P (Signed)
Steven Cohen is an 71 y.o. male.   Chief Complaint: Back pain HPI: 71 year old male remotely status post L4-5 and S1 decompression fusion surgery presents now with worsening back lower extremity symptoms consistent with neurogenic claudication.  Workup demonstrates adjacent level degeneration and stenosis at the L3-4 level.  He has failed conservative management.  Fusion appears solid at L4-5 and S1.  Patient presents now for decompression and fusion at L3-4 in hopes of improving his symptoms.  Past Medical History:  Diagnosis Date   Arthritis    Hypertension    PTSD (post-traumatic stress disorder)     Past Surgical History:  Procedure Laterality Date   BACK SURGERY  01/03/2018   Dr. Jordan Likes at [facility]   CHOLECYSTECTOMY      History reviewed. No pertinent family history. Social History:  reports that he has never smoked. He has never used smokeless tobacco. He reports current alcohol use. He reports that he does not use drugs.  Allergies:  Allergies  Allergen Reactions   Aspirin Itching and Rash   Gabapentin Itching and Rash   Penicillins Itching and Rash    Did it involve swelling of the face/tongue/throat, SOB, or low BP? No Did it involve sudden or severe rash/hives, skin peeling, or any reaction on the inside of your mouth or nose? No Did you need to seek medical attention at a hospital or doctor's office? No When did it last happen?10-15 years ago     If all above answers are "NO", may proceed with cephalosporin use.     Medications Prior to Admission  Medication Sig Dispense Refill   ammonium lactate (AMLACTIN) 12 % cream Apply 1 Application topically daily as needed for dry skin.     ARTIFICIAL TEAR OINTMENT OP Place 1 Application into both eyes at bedtime as needed (dry eyes).     Cholecalciferol (VITAMIN D3) 10 MCG (400 UNIT) tablet Take 400 Units by mouth once a week.     Coenzyme Q10-Vitamin E (QUNOL ULTRA COQ10 PO) Take 1 capsule by mouth at bedtime.      diclofenac Sodium (VOLTAREN ARTHRITIS PAIN) 1 % GEL Apply 2 g topically 4 (four) times daily as needed (pain).     ibuprofen (ADVIL) 200 MG tablet Take 200 mg by mouth every 6 (six) hours as needed for moderate pain.     lisinopril (ZESTRIL) 40 MG tablet Take 40 mg by mouth at bedtime.     MAGNESIUM PO Take 1 tablet by mouth at bedtime.     methocarbamol (ROBAXIN) 500 MG tablet Take 500 mg by mouth every 6 (six) hours as needed for muscle spasms.     Multiple Vitamins-Minerals (PRESERVISION AREDS 2 PO) Take 1 tablet by mouth daily.     THERATEARS 0.25 % SOLN Place 1 drop into both eyes 3 (three) times daily as needed (dry/irritation eyes).     TURMERIC PO Take 1 capsule by mouth daily.     vitamin C (ASCORBIC ACID) 500 MG tablet Take 500 mg by mouth daily.     vitamin E 400 UNIT capsule Take 400 Units by mouth once a week.      No results found for this or any previous visit (from the past 48 hour(s)). No results found.  Pertinent items noted in HPI and remainder of comprehensive ROS otherwise negative.  Blood pressure 125/82, pulse 87, temperature 99.4 F (37.4 C), resp. rate 18, height 5\' 9"  (1.753 m), weight 89.8 kg, SpO2 95 %.  Patient is awake and  alert.  He is oriented and appropriate.  Cranial nerve function intact.  Speech fluent.  Judgment intact.  Motor examination extremities normal.  Sensory examination with some patchy Ditzel sensory loss in both lower extremities.  Reflexes hypoactive.  No evidence of long track signs.  Gait normal.  Posture good.  Examination head ears eyes nose throat is unremarkable chest and abdomen are benign.  Extremities are free of major deformity. 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  Plan bilateral L3-4 decompressive laminotomies and foraminotomies followed by posterior lumbar body fusion utilizing interbody cages, with autograft , AND AUGMENTED WITH POSTERIOR LATERAL THESIS UTILIZING NONSEGMENTAL  PEDICLE SCREW FIXATION and local autograft.  Risk and benefits been explained.  Patient wishes to proceed.  [name] 01/20/2022, 3:08 PM

## 2022-01-20 NOTE — Brief Op Note (Signed)
01/20/2022  6:26 PM  PATIENT:  Steven Cohen  71 y.o. male  PRE-OPERATIVE DIAGNOSIS:  Stenosis  POST-OPERATIVE DIAGNOSIS:  Stenosis  PROCEDURE:  Procedure(s): Posterior Lumbar Interbody Fusion - Posterior Lateral and Interbody fusion - Lumbar Three-Lumbar Four (N/A)  SURGEON:  Surgeon(s) and Role:    * Julio Sicks, MD - Primary    * Dawley, Alan Mulder, DO - Assisting  PHYSICIAN ASSISTANT:   ASSISTANTSMarland Mcalpine   ANESTHESIA:   general  EBL:  150 mL   BLOOD ADMINISTERED:none  DRAINS: (med) Hemovact drain(s) in the deep wound space with  Suction Open   LOCAL MEDICATIONS USED:  MARCAINE     SPECIMEN:  No Specimen  DISPOSITION OF SPECIMEN:  N/A  COUNTS:  YES  TOURNIQUET:  * No tourniquets in log *  DICTATION: .Dragon Dictation  PLAN OF CARE: Admit for overnight observation  PATIENT DISPOSITION:  PACU - hemodynamically stable.   Delay start of Pharmacological VTE agent (>24hrs) due to surgical blood loss or risk of bleeding: yes

## 2022-01-20 NOTE — Anesthesia Procedure Notes (Signed)
Procedure Name: Intubation Date/Time: 01/20/2022 4:01 PM  Performed by: Rosiland Oz, CRNAPre-anesthesia Checklist: Patient identified, Emergency Drugs available, Suction available, Patient being monitored and Timeout performed Patient Re-evaluated:Patient Re-evaluated prior to induction Oxygen Delivery Method: Circle system utilized Preoxygenation: Pre-oxygenation with 100% oxygen Induction Type: IV induction Ventilation: Mask ventilation without difficulty Laryngoscope Size: Miller and 3 Grade View: Grade I Tube type: Oral Tube size: 7.5 mm Number of attempts: 1 Airway Equipment and Method: Stylet Placement Confirmation: ETT inserted through vocal cords under direct vision, positive ETCO2 and breath sounds checked- equal and bilateral Secured at: 22 cm Tube secured with: Tape Dental Injury: Teeth and Oropharynx as per pre-operative assessment

## 2022-01-20 NOTE — Op Note (Signed)
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  L3-4 posterior lumbar interbody fusion utilizing interbody cages, local Harvest autograft, and morselized allograft  L3-4 posterior lateral thesis utilizing nonsegmental pedicle screw fixation and local autografting  Removal of L5 and S1 pedicle screw instrumentation   Surgeon:Tatem Fesler A.Sundee Garland, M.D.  Asst. Surgeon: Jake Samples, DO; Doran Durand, NP  Anesthesia: General  Indication: 71 year old male status post L4-S1 decompression and fusion with good results in the past presents with worsening back and bilateral lower extremity symptoms failing conservative manage.  Workup demonstrates evidence of adjacent segment degeneration with significant stenosis and ongoing L4 nerve root compression bilaterally.  Patient presents now for decompression and fusion surgery in hopes of improving his symptoms.  Operative note: After induction of anesthesia, patient positioned prone on the Wilson frame and appropriate padded.  Lumbar region prepped and draped sterilely.  Incision made overlying L3-S1.  Dissection performed bilaterally.  Retractor placed.  Fluoroscopy used.  Levels confirmed.  Previously placed pedicle screws fusion at L4-5 and S1 was dissected free.  The construct was disassembled.  The rods were removed.  Fusion was inspected and found to be solid.  Screws at L5 and S1 were removed bilaterally.  Screws at L4 were left in place.  Attention was then placed to the L3-4 level.  Decompressive laminotomies and facetectomies were then performed using Leksell rongeur's, care centers and the high-speed drill to remove the inferior two thirds the lamina of L3 and the  entire inferior facet and pars interarticularis of L3, the majority of the superior facet of L4 was resected, and the superior aspect of the L4 lamina was resected.  Ligament flavum elevated and resected.  Underlying thecal sac minified and protected.  Foraminotomies complete on the course exiting L3 and L4 nerve roots.  Bilateral discectomy was then performed at L3-4.  Disc base then prepared for interbody fusion.  Disc base distraction on the right side.  On the left side the disc base was further prepared for fusion.  The cage was then packed into place and expanded.  Distractor removed patient's right side.  The space prepared on the right side.  Morselized autograft packed to the interspace.  A second Med[employer]onic expandable cage was then impacted in place and expanded.  Pedicles at L3 were identified using surface landmarks and intraoperative fluoroscopy.  Superficial bone overlying the pedicle was then removed using high-speed drill.  Each pedicle was then probed using a pedicle awl.  Each pedicle tract was probed and found to be solidly within the bone.  Each pedicle tract was then tapped with a screw tap.  Screw Hole was probed and found to be solidly within bone.  5.75 mm radius.  Screws from [employer] placed bilaterally at L3.  Final images revealed good position of the cages and the hardware at the proper operative level with normal alignment of the spine.  Transverse processes of L3 and L4 were decorticated.  Morselized autograft was packed posterolaterally.  A short segment titanium rod placed over the screws at L4 and L3.  Locking caps placed over the construct.  Locking caps engaged with the construct under compression.  Each cage was then packed with demineralized bone fibers.  Gelfoam was placed over the laminotomy defect.  Vancomycin powder placed in the deep  wound space.  A medium Hemovac drain was placed in the deep wound space.  Wound is then closed in layers with Vicryl sutures.   Steri-Strips and sterile dressing were applied.  No apparent complications.  Patient tolerated the procedure well and he returns to the recovery room postop.

## 2022-01-20 NOTE — Progress Notes (Signed)
Pharmacy Antibiotic Note  Steven Cohen is a 71 y.o. male admitted on 01/20/2022 with surgical prophylaxis.  Pharmacy has been consulted for Vancomycin dosing. Pt with closed system drain in place. Vanc 1gm given pre-op at 1135  Plan: Vancomycin 1500mg  IV q24h -first dose q12h after pre-op dose F/u SCr and for drain removal and ability to d/c vancomycin  Height: 5\' 9"  (175.3 cm) Weight: 89.8 kg (198 lb) IBW/kg (Calculated) : 70.7  Temp (24hrs), Avg:98.3 F (36.8 C), Min:97.5 F (36.4 C), Max:99.4 F (37.4 C)  Recent Labs  Lab 01/16/22 1200  WBC 8.3  CREATININE 1.35*    Estimated Creatinine Clearance: 54 mL/min (A) (by C-G formula based on SCr of 1.35 mg/dL (H)).    Allergies  Allergen Reactions   Aspirin Itching and Rash   Gabapentin Itching and Rash   Penicillins Itching and Rash    Did it involve swelling of the face/tongue/throat, SOB, or low BP? No Did it involve sudden or severe rash/hives, skin peeling, or any reaction on the inside of your mouth or nose? No Did you need to seek medical attention at a hospital or doctor's office? No When did it last happen?10-15 years ago     If all above answers are "NO", may proceed with cephalosporin use.     Antimicrobials this admission:  12/22 Vanc >>   Thank you for allowing pharmacy to be a part of this patient's care.  01/18/22, PharmD, BCPS Please see amion for complete clinical pharmacist phone list 01/20/2022 8:34 PM

## 2022-01-20 NOTE — Transfer of Care (Signed)
Immediate Anesthesia Transfer of Care Note  Patient: Steven Cohen  Procedure(s) Performed: Posterior Lumbar Interbody Fusion - Posterior Lateral and Interbody fusion - Lumbar Three-Lumbar Four (Back)  Patient Location: PACU  Anesthesia Type:General  Level of Consciousness: awake and patient cooperative  Airway & Oxygen Therapy: Patient Spontanous Breathing and Patient connected to face mask oxygen  Post-op Assessment: Report given to RN, Post -op Vital signs reviewed and stable, and Patient moving all extremities  Post vital signs: Reviewed and stable  Last Vitals:  Vitals Value Taken Time  BP 147/62 01/20/22 1830  Temp 36.4 C 01/20/22 1829  Pulse 64 01/20/22 1831  Resp 12 01/20/22 1831  SpO2 100 % 01/20/22 1831  Vitals shown include unvalidated device data.  Last Pain:  Vitals:   01/20/22 1829  PainSc: 0-No pain         Complications: No notable events documented.

## 2022-01-21 DIAGNOSIS — M48061 Spinal stenosis, lumbar region without neurogenic claudication: Secondary | ICD-10-CM | POA: Diagnosis not present

## 2022-01-21 MED ORDER — OXYCODONE HCL 10 MG PO TABS: 10.0000 mg | ORAL_TABLET | ORAL | 0 refills | Status: AC | PRN

## 2022-01-21 NOTE — Discharge Instructions (Signed)

## 2022-01-21 NOTE — Evaluation (Signed)
Occupational Therapy Evaluation Patient Details Name: Steven Cohen MRN: [id number] DOB: 07-23-1950 Today's Date: 01/21/2022   History of Present Illness 71 yo male admitted 12/22 for L3-4 PLIF. PMhx: L4-S1 PLIF, HTN, arthritis, PTSD   Clinical Impression   Patient evaluated by Occupational Therapy with no further acute OT needs identified. All education has been completed and the patient has no further questions. See below for any follow-up Occupational Therapy or equipment needs. OT to sign off. Thank you for referral.        Recommendations for follow up therapy are one component of a multi-disciplinary discharge planning process, led by the attending physician.  Recommendations may be updated based on patient status, additional functional criteria and insurance authorization.   Follow Up Recommendations  No OT follow up     Assistance Recommended at Discharge None  Patient can return home with the following Assist for transportation    Functional Status Assessment  Patient has had a recent decline in their functional status and demonstrates the ability to make significant improvements in function in a reasonable and predictable amount of time.  Equipment Recommendations  None recommended by OT    Recommendations for Other Services       Precautions / Restrictions Precautions Precautions: Back Precaution Booklet Issued: Yes (comment) Required Braces or Orthoses: Spinal Brace Spinal Brace: Lumbar corset;Applied in sitting position      Mobility Bed Mobility               General bed mobility comments: oob on arrival    Transfers Overall transfer level: Needs assistance   Transfers: Sit to/from Stand Sit to Stand: Supervision           General transfer comment: cues for scooting to the edge to push up      Balance Overall balance assessment: Mild deficits observed, not formally tested                                          ADL either performed or assessed with clinical judgement   ADL Overall ADL's : Needs assistance/impaired Eating/Feeding: Independent   Grooming: Wash/dry hands   Upper Body Bathing: Independent   Lower Body Bathing: Supervison/ safety   Upper Body Dressing : Independent   Lower Body Dressing: Supervision/safety Lower Body Dressing Details (indicate cue type and reason): educated on reacher and figure 4 positioning Toilet Transfer: Supervision/safety           Functional mobility during ADLs: Supervision/safety  Back handout provided and reviewed adls in detail. Pt educated on: clothing between brace, never sleep in brace, set an alarm at night for medication, avoid sitting for long periods of time, correct bed positioning for sleeping, correct sequence for bed mobility, avoiding lifting more than 5 pounds and never wash directly over incision. All education is complete and patient indicates understanding.      Vision Baseline Vision/History: 1 Wears glasses Patient Visual Report: No change from baseline       Perception     Praxis      Pertinent Vitals/Pain Pain Assessment Pain Assessment: 0-10 Pain Score: 6  Pain Location: incision Pain Descriptors / Indicators: Aching, Guarding Pain Intervention(s): Monitored during session, Premedicated before session, Repositioned, Limited activity within patient's tolerance     Hand Dominance Right   Extremity/Trunk Assessment Upper Extremity Assessment Upper Extremity Assessment: Overall WFL for tasks assessed  Lower Extremity Assessment Lower Extremity Assessment: Overall WFL for tasks assessed   Cervical / Trunk Assessment Cervical / Trunk Assessment: Back Surgery   Communication Communication Communication: No difficulties   Cognition Arousal/Alertness: Awake/alert Behavior During Therapy: WFL for tasks assessed/performed Overall Cognitive Status: Within Functional Limits for tasks assessed                                        General Comments  incision dry and new dressing intact. drain pulled at start of session by RN    Exercises Exercises: Other exercises Other Exercises Other Exercises: educated on supine core exercise x1   Shoulder Instructions      Home Living Family/patient expects to be discharged to:: Private residence Living Arrangements: Spouse/significant other Available Help at Discharge: Family;Available 24 hours/day Type of Home: House Home Access: Level entry     Home Layout: Two level;Bed/bath upstairs Alternate Level Stairs-Number of Steps: 13 Alternate Level Stairs-Rails: Right;Left Bathroom Shower/Tub: Chief Strategy Officer: Standard     Home Equipment: Agricultural consultant (2 wheels);Rollator (4 wheels);Cane - single point;BSC/3in1;Shower seat   Additional Comments: pt enjoys restoring cars, retired Actuary, has 1 dog and 2 cats      Prior Functioning/Environment Prior Level of Function : Independent/Modified Independent                        OT Problem List:        OT Treatment/Interventions:      OT Goals(Current goals can be found in the care plan section)    OT Frequency:      Co-evaluation              AM-PAC OT "6 Clicks" Daily Activity     Outcome Measure Help from another person eating meals?: None Help from another person taking care of personal grooming?: None Help from another person toileting, which includes using toliet, bedpan, or urinal?: None Help from another person bathing (including washing, rinsing, drying)?: A Little Help from another person to put on and taking off regular upper body clothing?: None Help from another person to put on and taking off regular lower body clothing?: A Little 6 Click Score: 22   End of Session Equipment Utilized During Treatment: Back brace Nurse Communication: Mobility status;Precautions  Activity Tolerance: Patient tolerated  treatment well Patient left: in chair;with call bell/phone within reach;with nursing/sitter in room  OT Visit Diagnosis: Unsteadiness on feet (R26.81)                Time: 4920-1007 OT Time Calculation (min): 50 min Charges:  OT General Charges $OT Visit: 1 Visit OT Evaluation $OT Eval Moderate Complexity: 1 Mod OT Treatments $Self Care/Home Management : 23-37 mins   Brynn, OTR/L  Acute Rehabilitation Services Office: [phone] .   Mateo Flow 01/21/2022, 9:38 AM

## 2022-01-21 NOTE — TOC Transition Note (Signed)
Transition of Care [facility]) - CM/SW Discharge Note   Patient Details  Name: Steven Cohen MRN: [id number] Date of Birth: 09/26/1950  Transition of Care [facility]) CM/SW Contact:  Lawerance Sabal, RN Phone Number: 01/21/2022, 9:40 AM   Clinical Narrative:      Unit staff to provide DME needed for home.   No HH needs identified  Patient will have family/ friends provide transportation home. No other TOC needs identified for DC        Patient Goals and CMS Choice      Discharge Placement                         Discharge Plan and Services Additional resources added to the After Visit Summary for                                       Social Determinants of Health (SDOH) Interventions SDOH Screenings   Tobacco Use: Low Risk  (01/20/2022)     Readmission Risk Interventions     No data to display

## 2022-01-21 NOTE — Evaluation (Signed)
Physical Therapy Evaluation Patient Details Name: Steven Cohen MRN: [id number] DOB: 1950-08-04 Today's Date: 01/21/2022  History of Present Illness  71 yo male admitted 12/22 for L3-4 PLIF. PMhx: L4-S1 PLIF, HTN, arthritis, PTSD  Clinical Impression  Pt pleasant with multiple pieces of DME at home and denied need for any acutely. Pt with decreased speed and increased pain on return gait after hall ambulation with encouragement to use RW vs cane acutely as needed for activity tolerance. Pt and spouse educated for all back precautions with handout provided and pt able to perform basic transfers and gait without significant physical assist. Pt safe for return home with family.        Recommendations for follow up therapy are one component of a multi-disciplinary discharge planning process, led by the attending physician.  Recommendations may be updated based on patient status, additional functional criteria and insurance authorization.  Follow Up Recommendations No PT follow up      Assistance Recommended at Discharge PRN  Patient can return home with the following  Assistance with cooking/housework;Assist for transportation    Equipment Recommendations None recommended by PT  Recommendations for Other Services       Functional Status Assessment Patient has had a recent decline in their functional status and demonstrates the ability to make significant improvements in function in a reasonable and predictable amount of time.     Precautions / Restrictions Precautions Precautions: Back Precaution Booklet Issued: Yes (comment) Required Braces or Orthoses: Spinal Brace Spinal Brace: Lumbar corset;Applied in sitting position      Mobility  Bed Mobility Overal bed mobility: Modified Independent Bed Mobility: Rolling, Sidelying to Sit Rolling: Modified independent (Device/Increase time) Sidelying to sit: Modified independent (Device/Increase time)             Transfers Overall transfer level: Needs assistance   Transfers: Sit to/from Stand Sit to Stand: Supervision           General transfer comment: supervision for safety with good posture to rise    Ambulation/Gait Ambulation/Gait assistance: Supervision Gait Distance (Feet): 400 Feet Assistive device: Straight cane Gait Pattern/deviations: Step-through pattern, Decreased stride length   Gait velocity interpretation: >2.62 ft/sec, indicative of community ambulatory   General Gait Details: pt with good posture and sequence of cane use, increased antalgic gait with return walk after stairs with education for RW use for long hall ambulation to improve stability and decrease pain  Stairs Stairs: Yes Stairs assistance: Modified independent (Device/Increase time) Stair Management: One rail Left, With cane, Alternating pattern, Forwards Number of Stairs: 11 General stair comments: good stablity and posture with stair with cane and railing  Wheelchair Mobility    Modified Rankin (Stroke Patients Only)       Balance Overall balance assessment: Mild deficits observed, not formally tested                                           Pertinent Vitals/Pain Pain Assessment Pain Assessment: 0-10 Pain Score: 6  Pain Location: incisional with increase to 8/10 with return gait Pain Descriptors / Indicators: Aching, Guarding Pain Intervention(s): Limited activity within patient's tolerance, Monitored during session, Repositioned    Home Living Family/patient expects to be discharged to:: Private residence Living Arrangements: Spouse/significant other Available Help at Discharge: Family;Available 24 hours/day Type of Home: House Home Access: Level entry     Alternate Level Stairs-Number of  Steps: 13 Home Layout: Two level;Bed/bath upstairs Home Equipment: Rolling Walker (2 wheels);Rollator (4 wheels);Cane - single point;BSC/3in1;Shower seat Additional  Comments: pt enjoys restoring cars, retired Actuary    Prior Function Prior Level of Function : Independent/Modified Independent                     Higher education careers adviser        Extremity/Trunk Assessment   Upper Extremity Assessment Upper Extremity Assessment: Overall WFL for tasks assessed    Lower Extremity Assessment Lower Extremity Assessment: Generalized weakness    Cervical / Trunk Assessment Cervical / Trunk Assessment: Back Surgery  Communication   Communication: No difficulties  Cognition Arousal/Alertness: Awake/alert Behavior During Therapy: WFL for tasks assessed/performed Overall Cognitive Status: Within Functional Limits for tasks assessed                                          General Comments      Exercises     Assessment/Plan    PT Assessment Patient needs continued PT services  PT Problem List Decreased activity tolerance;Decreased knowledge of use of DME;Pain       PT Treatment Interventions Gait training;Patient/family education;Therapeutic activities;Functional mobility training    PT Goals (Current goals can be found in the Care Plan section)  Acute Rehab PT Goals Patient Stated Goal: return to refurbishing cars PT Goal Formulation: With patient Time For Goal Achievement: 01/28/22 Potential to Achieve Goals: Good    Frequency Min 4X/week     Co-evaluation               AM-PAC PT "6 Clicks" Mobility  Outcome Measure Help needed turning from your back to your side while in a flat bed without using bedrails?: None Help needed moving from lying on your back to sitting on the side of a flat bed without using bedrails?: None Help needed moving to and from a bed to a chair (including a wheelchair)?: None Help needed standing up from a chair using your arms (e.g., wheelchair or bedside chair)?: None Help needed to walk in hospital room?: A Little Help needed climbing 3-5 steps with a railing? :  None 6 Click Score: 23    End of Session Equipment Utilized During Treatment: Back brace Activity Tolerance: Patient tolerated treatment well Patient left: in chair;with call bell/phone within reach;with family/visitor present Nurse Communication: Mobility status;Precautions PT Visit Diagnosis: Other abnormalities of gait and mobility (R26.89)    Time: 7588-3254 PT Time Calculation (min) (ACUTE ONLY): 21 min   Charges:   PT Evaluation $PT Eval Moderate Complexity: 1 Mod          Leanord Thibeau P, PT Acute Rehabilitation Services Office: [phone]   Enedina Finner Shanee Batch 01/21/2022, 9:01 AM

## 2022-01-21 NOTE — Discharge Summary (Signed)
Physician Discharge Summary  Patient ID: Steven Cohen MRN: [id number] DOB/AGE: May 21, 1950 71 y.o.  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  Postoperatively doing very well.  Standing ambulating and voiding without difficulty.  Ready for discharge home.  Consults:   Significant Diagnostic Studies:   Treatments:   Discharge Exam: Blood pressure 124/65, pulse 71, temperature 98.4 F (36.9 C), temperature source Oral, resp. rate 18, height 5\' 9"  (1.753 m), weight 89.8 kg, SpO2 99 %. Awake and alert.  Oriented and appropriate.  Motor and sensory function intact.  Wound clean and dry.  Chest and abdomen benign.  Disposition: Discharge disposition: 01-Home or Self Care        Allergies as of 01/21/2022       Reactions   Aspirin Itching, Rash   Gabapentin Itching, Rash   Penicillins Itching, Rash   Did it involve swelling of the face/tongue/throat, SOB, or low BP? No Did it involve sudden or severe rash/hives, skin peeling, or any reaction on the inside of your mouth or nose? No Did you need to seek medical attention at a hospital or doctor's office? No When did it last happen?10-15 years ago     If all above answers are "NO", may proceed with cephalosporin use.        Medication List     TAKE these medications    ammonium lactate 12 % cream Commonly known as: AMLACTIN Apply 1 Application topically daily as needed for dry skin.   ARTIFICIAL TEAR OINTMENT OP Place 1 Application into both eyes at bedtime as needed (dry eyes).   ascorbic acid 500 MG tablet Commonly known as: VITAMIN C Take 500 mg by mouth daily.   ibuprofen 200 MG tablet Commonly known  as: ADVIL Take 200 mg by mouth every 6 (six) hours as needed for moderate pain.   lisinopril 40 MG tablet Commonly known as: ZESTRIL Take 40 mg by mouth at bedtime.   MAGNESIUM PO Take 1 tablet by mouth at bedtime.   methocarbamol 500 MG tablet Commonly known as: ROBAXIN Take 500 mg by mouth every 6 (six) hours as needed for muscle spasms.   Oxycodone HCl 10 MG Tabs Take 1 tablet (10 mg total) by mouth every 3 (three) hours as needed for severe pain ((score 7 to 10)).   PRESERVISION AREDS 2 PO Take 1 tablet by mouth daily.   QUNOL ULTRA COQ10 PO Take 1 capsule by mouth at bedtime.   Theratears 0.25 % Soln Generic drug: Carboxymethylcellulose Sodium Place 1 drop into both eyes 3 (three) times daily as needed (dry/irritation eyes).   TURMERIC PO Take 1 capsule by mouth daily.   Vitamin D3 10 MCG (400 UNIT) tablet Take 400 Units by mouth once a week.   vitamin E 180 MG (400 UNITS) capsule Take 400 Units by mouth once a week.   Voltaren Arthritis Pain 1 % Gel Generic drug: diclofenac Sodium Apply 2 g topically 4 (four) times daily as needed (pain).               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

## 2022-01-21 NOTE — Progress Notes (Signed)
Patient is discharged from room 3C11 at this time. Alert and in stable condition. IV site d/c'd and instructions read to patient and spouse with understanding verbalized and all questions answered. Left unit via wheelchair with all belongings at side. 

## 2022-01-24 MED FILL — Thrombin For Soln 20000 Unit: CUTANEOUS | Qty: 1 | Status: AC

## 2022-01-25 ENCOUNTER — Encounter (HOSPITAL_COMMUNITY): Payer: Self-pay | Admitting: Neurosurgery

## 2022-01-30 NOTE — Anesthesia Postprocedure Evaluation (Signed)
Anesthesia Post Note  Patient: Steven Cohen  Procedure(s) Performed: Posterior Lumbar Interbody Fusion - Posterior Lateral and Interbody fusion - Lumbar Three-Lumbar Four (Back)     Patient location during evaluation: PACU Anesthesia Type: General Level of consciousness: awake and patient cooperative Pain management: pain level controlled Vital Signs Assessment: post-procedure vital signs reviewed and stable Respiratory status: spontaneous breathing, nonlabored ventilation and respiratory function stable Cardiovascular status: blood pressure returned to baseline and stable Postop Assessment: no apparent nausea or vomiting Anesthetic complications: no  No notable events documented.  Last Vitals:  Vitals:   01/21/22 0449 01/21/22 0722  BP: 131/70 124/65  Pulse: 69 71  Resp: 18 18  Temp: 37.1 C 36.9 C  SpO2: 95% 99%    Last Pain:  Vitals:   01/21/22 0950  TempSrc:   PainSc: 6                  Bernadine Melecio

## 2022-03-17 MED FILL — Heparin Sodium (Porcine) Inj 1000 Unit/ML: INTRAMUSCULAR | Qty: 30 | Status: AC

## 2022-03-17 MED FILL — Sodium Chloride IV Soln 0.9%: INTRAVENOUS | Qty: 1000 | Status: AC
